# Patient Record
Sex: Male | Born: 1955 | Race: White | Hispanic: No | Smoking: Former smoker
Health system: Southern US, Community
[De-identification: ages and names within clinical notes are randomized; demographics above are authoritative.]

## PROBLEM LIST (undated history)

## (undated) DIAGNOSIS — I5189 Other ill-defined heart diseases: Secondary | ICD-10-CM

## (undated) DIAGNOSIS — Z8249 Family history of ischemic heart disease and other diseases of the circulatory system: Secondary | ICD-10-CM

## (undated) DIAGNOSIS — K5790 Diverticulosis of intestine, part unspecified, without perforation or abscess without bleeding: Secondary | ICD-10-CM

## (undated) DIAGNOSIS — I10 Essential (primary) hypertension: Secondary | ICD-10-CM

## (undated) DIAGNOSIS — R0602 Shortness of breath: Secondary | ICD-10-CM

## (undated) DIAGNOSIS — I499 Cardiac arrhythmia, unspecified: Secondary | ICD-10-CM

## (undated) DIAGNOSIS — D490 Neoplasm of unspecified behavior of digestive system: Secondary | ICD-10-CM

## (undated) HISTORY — DX: Other ill-defined heart diseases: I51.89

## (undated) HISTORY — DX: Family history of ischemic heart disease and other diseases of the circulatory system: Z82.49

---

## 1999-07-27 HISTORY — PX: BACK SURGERY: SHX140

## 1999-09-20 ENCOUNTER — Ambulatory Visit (HOSPITAL_COMMUNITY): Admission: RE | Admit: 1999-09-20 | Discharge: 1999-09-20 | Payer: Self-pay | Admitting: Family Medicine

## 1999-09-20 ENCOUNTER — Encounter: Payer: Self-pay | Admitting: Family Medicine

## 1999-10-01 ENCOUNTER — Encounter: Payer: Self-pay | Admitting: Neurosurgery

## 1999-10-02 ENCOUNTER — Inpatient Hospital Stay (HOSPITAL_COMMUNITY): Admission: RE | Admit: 1999-10-02 | Discharge: 1999-10-03 | Payer: Self-pay | Admitting: Neurosurgery

## 1999-10-02 ENCOUNTER — Encounter: Payer: Self-pay | Admitting: Neurosurgery

## 2002-07-26 HISTORY — PX: HERNIA REPAIR: SHX51

## 2006-04-27 ENCOUNTER — Encounter: Admission: RE | Admit: 2006-04-27 | Discharge: 2006-04-27 | Payer: Self-pay | Admitting: *Deleted

## 2011-07-09 ENCOUNTER — Encounter (HOSPITAL_COMMUNITY)
Admission: RE | Disposition: A | Discharge status: Home / Self Care | Payer: Self-pay | Source: Ambulatory Visit | Attending: Gastroenterology

## 2011-07-09 ENCOUNTER — Encounter (HOSPITAL_COMMUNITY): Payer: Self-pay | Admitting: *Deleted

## 2011-07-09 ENCOUNTER — Other Ambulatory Visit: Payer: Self-pay | Admitting: Gastroenterology

## 2011-07-09 ENCOUNTER — Ambulatory Visit (HOSPITAL_COMMUNITY)
Admission: RE | Admit: 2011-07-09 | Discharge: 2011-07-09 | Disposition: A | Discharge status: Home / Self Care | Payer: BC Managed Care – PPO | Source: Ambulatory Visit | Attending: Gastroenterology | Admitting: Gastroenterology

## 2011-07-09 DIAGNOSIS — K319 Disease of stomach and duodenum, unspecified: Secondary | ICD-10-CM | POA: Insufficient documentation

## 2011-07-09 DIAGNOSIS — Z8043 Family history of malignant neoplasm of testis: Secondary | ICD-10-CM | POA: Insufficient documentation

## 2011-07-09 DIAGNOSIS — Z7982 Long term (current) use of aspirin: Secondary | ICD-10-CM | POA: Insufficient documentation

## 2011-07-09 HISTORY — DX: Diverticulosis of intestine, part unspecified, without perforation or abscess without bleeding: K57.90

## 2011-07-09 HISTORY — PX: EUS: SHX5427

## 2011-07-09 SURGERY — UPPER ENDOSCOPIC ULTRASOUND (EUS) LINEAR
Anesthesia: Moderate Sedation

## 2011-07-09 MED ORDER — SODIUM CHLORIDE 0.9 % IV SOLN
Freq: Once | INTRAVENOUS | Status: AC
  Administered 2011-07-09: 500 mL via INTRAVENOUS

## 2011-07-09 MED ORDER — LIDOCAINE VISCOUS 2 % MT SOLN
OROMUCOSAL | Status: DC | PRN
  Administered 2011-07-09: 5 mL via OROMUCOSAL

## 2011-07-09 MED ORDER — FENTANYL CITRATE 0.05 MG/ML IJ SOLN
INTRAMUSCULAR | Status: AC
  Filled 2011-07-09: qty 4

## 2011-07-09 MED ORDER — FENTANYL CITRATE 0.05 MG/ML IJ SOLN
INTRAMUSCULAR | Status: DC | PRN
  Administered 2011-07-09 (×4): 25 ug via INTRAVENOUS

## 2011-07-09 MED ORDER — DIPHENHYDRAMINE HCL 50 MG/ML IJ SOLN
INTRAMUSCULAR | Status: AC
  Filled 2011-07-09: qty 1

## 2011-07-09 MED ORDER — MIDAZOLAM HCL 10 MG/2ML IJ SOLN
INTRAMUSCULAR | Status: AC
  Filled 2011-07-09: qty 4

## 2011-07-09 MED ORDER — LIDOCAINE VISCOUS 2 % MT SOLN
OROMUCOSAL | Status: AC
  Filled 2011-07-09: qty 15

## 2011-07-09 MED ORDER — MIDAZOLAM HCL 10 MG/2ML IJ SOLN
INTRAMUSCULAR | Status: DC | PRN
  Administered 2011-07-09 (×5): 2 mg via INTRAVENOUS

## 2011-07-09 MED ORDER — DIPHENHYDRAMINE HCL 50 MG/ML IJ SOLN
INTRAMUSCULAR | Status: DC | PRN
  Administered 2011-07-09: 12.5 mg via INTRAVENOUS

## 2011-07-09 NOTE — H&P (Signed)
  Jerry Grant is an 55 y.o. male.   Chief Complaint: ? Gastric GIST HPI: The patient had 72 hours of melena last week.  His EGD/Colonoscopy as an outpatient revealed a 1.5-2 cm submucosal lesion in the proximal body of the stomach.  There was an ulceration on this lesion and it was friable.  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   No past medical history on file.  No past surgical history on file.  No family history on file. Social History:  does not have a smoking history on file. He does not have any smokeless tobacco history on file. His alcohol and drug histories not on file.  Allergies: Not on File  No current facility-administered medications on file as of 07/09/2011.   Medications Prior to Admission  Medication Sig Dispense Refill  . aspirin 81 MG tablet Take 81 mg by mouth daily.          No results found for this or any previous visit (from the past 48 hour(s)). No results found.  ROS:  As stated above in the HPI, otherwise negative.  General appearance: alert and no distress Resp: clear to auscultation bilaterally Cardio: regular rate and rhythm, S1, S2 normal, no murmur, click, rub or gallop GI: soft, non-tender; bowel sounds normal; no masses,  no organomegaly Extremities: extremities normal, atraumatic, no cyanosis or edema There were no vitals taken for this visit.   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  Plan: 1) EUS with FNA.  Markham Dumlao D 07/09/2011, 1:45 PM

## 2011-07-09 NOTE — Interval H&P Note (Signed)
History and Physical Interval Note:  07/09/2011 1:48 PM  Jerry Grant  has presented today for surgery, with the diagnosis of submucosal lesion with FNA  The various methods of treatment have been discussed with the patient and family. After consideration of risks, benefits and other options for treatment, the patient has consented to  Procedure(s): UPPER ENDOSCOPIC ULTRASOUND (EUS) LINEAR as a surgical intervention .  [name] history has been reviewed, patient examined, no change in status, stable for surgery.  I have reviewed the patients' chart and labs.  Questions were answered to the patient's satisfaction.     Jaxtyn Linville D

## 2011-07-09 NOTE — Op Note (Signed)
[facility] [address] [place], [place]  [address]  ENDOSCOPIC ULTRASOUND PROCEDURE REPORT  PATIENT:  Jerry Grant, Jerry Grant  MR#:  [id number] BIRTHDATE:  04/20/1956  GENDER:  male ENDOSCOPIST:  Jeani Hawking, MD PROCEDURE DATE:  07/09/2011 PROCEDURE:  Upper EUS w/FNA ASA CLASS:  Class I INDICATIONS:  Submucosal gastric lesion MEDICATIONS:   Fentanyl 100 mcg IV, Versed 10 mg IV, Benadryl 12.5 mg IV  DESCRIPTION OF PROCEDURE:   After the risks benefits and alternatives of the procedure were  explained, informed consent was obtained. The patient was then placed in the left, lateral, decubitus postion and IV sedation was administered. Throughout the procedure, the patient's blood pressure, pulse and oxygen saturations were monitored continuously.  Under direct visualization, the 110536(edit list) endoscope was introduced through the mouth and advanced to the stomach body.  Water was used as necessary to provide an acoustic interface.  Upon completion of the imaging, water was removed and the patient was sent to the recovery room in satisfactory condition. <<PROCEDUREIMAGES>>  FINDINGS:   The submucosal proximal gastric lesion was again identified. It measured 2.5 x 1.8 cm. The lesion was hypechoic with well-defined borders on ultrasound. The initial pass with the 19 guage Procore needle did not adequately penetrate the lesion, but some samples were obtained. The next four passes were with the 25 guage FNA needle. After the biopsies the plan was to tattoo the site, but the patient started to wretch.   No significant bleeding was identified after the FNA. At that point, the procedure was terminated.    The scope was then withdrawn from the patient and the procedure completed.  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 2) Surgical evaluation as the  pateint was symptomatic with bleeding from the site as the initial presentation.  ______________________________ Jeani Hawking, MD  n. Rosalie DoctorJeani Hawking at 07/09/2011 03:33 PM  Bobbie Stack, [phone]

## 2011-07-12 ENCOUNTER — Encounter (HOSPITAL_COMMUNITY): Payer: Self-pay | Admitting: Gastroenterology

## 2011-07-12 ENCOUNTER — Encounter (HOSPITAL_COMMUNITY): Payer: Self-pay

## 2011-07-13 ENCOUNTER — Telehealth (INDEPENDENT_AMBULATORY_CARE_PROVIDER_SITE_OTHER): Payer: Self-pay | Admitting: General Surgery

## 2011-07-13 NOTE — Telephone Encounter (Signed)
LMOM for pt and his wife.  We are waiting to schedule his appt most likely with Dr. Johna Sheriff who will be able to schedule him for surgery sooner.

## 2011-07-13 NOTE — Telephone Encounter (Signed)
Dr. Elnoria Howard is referring this patient over to see Dr. Armanda Heritage, he is only here 3 days in January, and the February schedule is not out yet, please call.

## 2011-07-14 ENCOUNTER — Encounter (HOSPITAL_COMMUNITY): Payer: Self-pay | Admitting: Pharmacy Technician

## 2011-07-16 ENCOUNTER — Encounter (HOSPITAL_COMMUNITY): Payer: Self-pay

## 2011-07-16 ENCOUNTER — Encounter (HOSPITAL_COMMUNITY)
Admission: RE | Admit: 2011-07-16 | Discharge: 2011-07-16 | Disposition: A | Discharge status: Home / Self Care | Payer: BC Managed Care – PPO | Source: Ambulatory Visit | Attending: General Surgery | Admitting: General Surgery

## 2011-07-16 DIAGNOSIS — R0602 Shortness of breath: Secondary | ICD-10-CM

## 2011-07-16 DIAGNOSIS — I499 Cardiac arrhythmia, unspecified: Secondary | ICD-10-CM

## 2011-07-16 DIAGNOSIS — D490 Neoplasm of unspecified behavior of digestive system: Secondary | ICD-10-CM

## 2011-07-16 HISTORY — DX: Neoplasm of unspecified behavior of digestive system: D49.0

## 2011-07-16 HISTORY — DX: Shortness of breath: R06.02

## 2011-07-16 HISTORY — PX: LAMINOTOMY: SHX998

## 2011-07-16 HISTORY — PX: VASECTOMY: SHX75

## 2011-07-16 HISTORY — DX: Cardiac arrhythmia, unspecified: I49.9

## 2011-07-16 LAB — SURGICAL PCR SCREEN: MRSA, PCR: NEGATIVE

## 2011-07-16 LAB — CBC
MCH: 32.9 pg (ref 26.0–34.0)
MCHC: 34.9 g/dL (ref 30.0–36.0)
MCV: 94.2 fL (ref 78.0–100.0)
Platelets: 202 10*3/uL (ref 150–400)
RDW: 13.9 % (ref 11.5–15.5)

## 2011-07-16 NOTE — Patient Instructions (Signed)
[address] QU[address] FERNS  07/16/2011   Your procedure is scheduled on: 07-28-11  Report to [facility] at  0800 AM.  Call this number if you have problems the morning of surgery: [phone]   Remember:   Do not eat food:After Midnight.  May have clear liquids:until Midnight .  Clear liquids include soda, tea, black coffee, apple or grape juice, broth.  Take these medicines the morning of surgery with A SIP OF WATER:none   Do not wear jewelry, make-up or nail polish.  Do not wear lotions, powders, or perfumes. You may wear deodorant.  Do not shave 48 hours prior to surgery.  Do not bring valuables to the hospital.  Contacts, dentures or bridgework may not be worn into surgery.  Leave suitcase in the car. After surgery it may be brought to your room.  For patients admitted to the hospital, checkout time is 11:00 AM the day of discharge.   Patients discharged the day of surgery will not be allowed to drive home.  Name and phone number of your driver:spouse  Special Instructions: CHG Shower Use Special Wash: 1/2 bottle night before surgery and 1/2 bottle morning of surgery.   Please read over the following fact sheets that you were given: Blood Transfusion Information and MRSA Information

## 2011-07-16 NOTE — Pre-Procedure Instructions (Signed)
07-16-11 1630 Pt. Notified of positive PCR screen for staph aureus-will get RX filled for Mupirocin Oint.

## 2011-07-23 ENCOUNTER — Encounter (INDEPENDENT_AMBULATORY_CARE_PROVIDER_SITE_OTHER): Payer: Self-pay | Admitting: General Surgery

## 2011-07-23 ENCOUNTER — Other Ambulatory Visit (INDEPENDENT_AMBULATORY_CARE_PROVIDER_SITE_OTHER): Payer: Self-pay | Admitting: General Surgery

## 2011-07-23 ENCOUNTER — Ambulatory Visit (INDEPENDENT_AMBULATORY_CARE_PROVIDER_SITE_OTHER): Payer: BC Managed Care – PPO | Admitting: General Surgery

## 2011-07-23 DIAGNOSIS — D214 Benign neoplasm of connective and other soft tissue of abdomen: Secondary | ICD-10-CM | POA: Insufficient documentation

## 2011-07-23 NOTE — Progress Notes (Signed)
Subjective:   Episode of intestinal bleeding and GI ST tumor  Patient ID: Jerry Grant, male   DOB: 12/23/1955, 55 y.o.   MRN: [id number]  HPI Patient is a 55 year old male who just over 2 weeks ago had an episode of melanotic stool. He was therefore referred for endoscopy which was performed by Dr. Elnoria Howard. Patient was not hospitalized. He did not require transfusion. I've reviewed the endoscopy which showed an approximately 2 cm submucosal tumor in the proximal body of the stomach with overlying umbilication consistent with bleeding source, probable GIS tumor. The patient subsequently has had an ultrasound confirming an approximately 2.5 cm submucosal mass. Fine needle aspiration was performed which returned with a very broad differential diagnosis including just as well as other tumors. The patient has been asymptomatic since that episode. Dr. Elnoria Howard has been out of town and unable to discuss the exact location of the tumor, i.e. lesser or greater curve but I left a message will plan to talk to him preoperatively. Patient is now here for surgical evaluation.  Past Medical History  Diagnosis Date  . Diverticulosis   . Dysrhythmia 07-16-11    '05/'06 issues with rapid heart beat, stress negative.  . Shortness of breath 07-16-11    2 weeks ago-testing found related to gastric tumor with bleeding  . Gastric tumor 07-16-11    dx. 07-08-11, now surgery planned   Past Surgical History  Procedure Date  . Laminotomy 07-16-11    lumbar 4-5 disc surgery  . Eus 07/09/2011    Procedure: UPPER ENDOSCOPIC ULTRASOUND (EUS) LINEAR;  Surgeon: Theda Belfast;  Location: [facility];  Service: Endoscopy;  Laterality: N/A;  . Vasectomy 07-16-11    '89   . Hernia repair 2004    Rt. inguinal hernia repair   Current Outpatient Prescriptions  Medication Sig Dispense Refill  . aspirin EC 81 MG tablet Take 81 mg by mouth daily after breakfast.         No Known Allergies History  Substance Use Topics  .  Smoking status: Former Smoker    Quit date: 07/15/1984  . Smokeless tobacco: Not on file  . Alcohol Use: No    Review of Systems  HENT: Negative.   Respiratory: Negative.   Cardiovascular: Negative.   Gastrointestinal: Negative.   Genitourinary: Negative.   Musculoskeletal: Negative.   Hematological: Negative.        Objective:   Physical Exam General: Alert, well-developed Caucasian male, in no distress Skin: Warm and dry without rash or infection. HEENT: No palpable masses or thyromegaly. Sclera nonicteric. Pupils equal round and reactive. Oropharynx clear. Lymph nodes: No cervical, supraclavicular, or inguinal nodes palpable. Lungs: Breath sounds clear and equal without increased work of breathing Cardiovascular: Regular rate and rhythm without murmur. No JVD or edema. Peripheral pulses intact. Abdomen: Nondistended. Soft and nontender. No masses palpable. No organomegaly. No palpable hernias. Extremities: No edema or joint swelling or deformity. No chronic venous stasis changes. Neurologic: Alert and fully oriented. Gait normal.     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 This clearly will need to be resected. With this position in the midbody the stomach I think a laparoscopic resection would be quite feasible. I discussed the nature of the illness the surgery and its indications as well as risks of bleeding, infection, staple line leak, anesthetic complications, and possible leak for surgery with the patient and his wife. All her questions were answered. Surgery  is scheduled for next week.    Plan:     Laparoscopic and possible open resection of probable GI ST tumor of the stomach.

## 2011-07-27 MED ORDER — CEFAZOLIN SODIUM 1-5 GM-% IV SOLN: 1.0000 g | Freq: Three times a day (TID) | INTRAVENOUS | Status: DC

## 2011-07-28 ENCOUNTER — Inpatient Hospital Stay (HOSPITAL_COMMUNITY): Payer: BC Managed Care – PPO | Admitting: Anesthesiology

## 2011-07-28 ENCOUNTER — Encounter (HOSPITAL_COMMUNITY)
Admission: RE | Disposition: A | Discharge status: Home / Self Care | Payer: Self-pay | Source: Ambulatory Visit | Attending: General Surgery

## 2011-07-28 ENCOUNTER — Encounter (HOSPITAL_COMMUNITY): Payer: Self-pay | Admitting: Anesthesiology

## 2011-07-28 ENCOUNTER — Inpatient Hospital Stay (HOSPITAL_COMMUNITY)
Admission: RE | Admit: 2011-07-28 | Discharge: 2011-07-30 | DRG: 155 | Disposition: A | Discharge status: Home / Self Care | Payer: BC Managed Care – PPO | Source: Ambulatory Visit | Attending: General Surgery | Admitting: General Surgery

## 2011-07-28 ENCOUNTER — Other Ambulatory Visit (INDEPENDENT_AMBULATORY_CARE_PROVIDER_SITE_OTHER): Payer: Self-pay | Admitting: General Surgery

## 2011-07-28 ENCOUNTER — Encounter (HOSPITAL_COMMUNITY): Payer: Self-pay | Admitting: *Deleted

## 2011-07-28 DIAGNOSIS — D214 Benign neoplasm of connective and other soft tissue of abdomen: Secondary | ICD-10-CM

## 2011-07-28 DIAGNOSIS — D131 Benign neoplasm of stomach: Principal | ICD-10-CM | POA: Diagnosis present

## 2011-07-28 HISTORY — PX: GASTRECTOMY: SHX58

## 2011-07-28 LAB — CBC
MCV: 95.8 fL (ref 78.0–100.0)
Platelets: 148 10*3/uL — ABNORMAL LOW (ref 150–400)
RBC: 4.09 MIL/uL — ABNORMAL LOW (ref 4.22–5.81)
WBC: 10.6 10*3/uL — ABNORMAL HIGH (ref 4.0–10.5)

## 2011-07-28 LAB — CREATININE, SERUM
Creatinine, Ser: 1.02 mg/dL (ref 0.50–1.35)
GFR calc Af Amer: >90 mL/min (ref >90)
GFR calc non Af Amer: 80 mL/min — ABNORMAL LOW (ref >90)

## 2011-07-28 SURGERY — LAPAROSCOPIC PARTIAL GASTRECTOMY
Anesthesia: General | Site: Abdomen | Wound class: Clean Contaminated

## 2011-07-28 MED ORDER — GLYCOPYRROLATE 0.2 MG/ML IJ SOLN
INTRAMUSCULAR | Status: DC | PRN
  Administered 2011-07-28: .7 mg via INTRAVENOUS

## 2011-07-28 MED ORDER — ACETAMINOPHEN 10 MG/ML IV SOLN
1000.0000 mg | Freq: Four times a day (QID) | INTRAVENOUS | Status: AC
  Administered 2011-07-28 – 2011-07-29 (×4): 1000 mg via INTRAVENOUS
  Filled 2011-07-28 (×4): qty 100

## 2011-07-28 MED ORDER — LIDOCAINE HCL (CARDIAC) 20 MG/ML IV SOLN
INTRAVENOUS | Status: DC | PRN
  Administered 2011-07-28: 50 mg via INTRAVENOUS

## 2011-07-28 MED ORDER — BUPIVACAINE LIPOSOME 1.3 % IJ SUSP
20.0000 mL | Freq: Once | INTRAMUSCULAR | Status: DC
  Filled 2011-07-28: qty 20

## 2011-07-28 MED ORDER — CEFAZOLIN SODIUM 1-5 GM-% IV SOLN
INTRAVENOUS | Status: DC | PRN
  Administered 2011-07-28: 2 g via INTRAVENOUS

## 2011-07-28 MED ORDER — BUPIVACAINE LIPOSOME 1.3 % IJ SUSP
INTRAMUSCULAR | Status: DC | PRN
  Administered 2011-07-28: 20 mL

## 2011-07-28 MED ORDER — FENTANYL CITRATE 0.05 MG/ML IJ SOLN
INTRAMUSCULAR | Status: DC | PRN
  Administered 2011-07-28: 100 ug via INTRAVENOUS
  Administered 2011-07-28: 50 ug via INTRAVENOUS
  Administered 2011-07-28: 100 ug via INTRAVENOUS

## 2011-07-28 MED ORDER — PROPOFOL 10 MG/ML IV BOLUS
INTRAVENOUS | Status: DC | PRN
  Administered 2011-07-28: 180 mg via INTRAVENOUS

## 2011-07-28 MED ORDER — ROCURONIUM BROMIDE 100 MG/10ML IV SOLN
INTRAVENOUS | Status: DC | PRN
  Administered 2011-07-28: 10 mg via INTRAVENOUS
  Administered 2011-07-28: 50 mg via INTRAVENOUS
  Administered 2011-07-28: 10 mg via INTRAVENOUS

## 2011-07-28 MED ORDER — ONDANSETRON HCL 4 MG/2ML IJ SOLN
INTRAMUSCULAR | Status: DC | PRN
  Administered 2011-07-28: 4 mg via INTRAVENOUS

## 2011-07-28 MED ORDER — HYDROMORPHONE HCL PF 1 MG/ML IJ SOLN
INTRAMUSCULAR | Status: DC | PRN
  Administered 2011-07-28: 1 mg via INTRAVENOUS
  Administered 2011-07-28: 0.5 mg via INTRAVENOUS
  Administered 2011-07-28: .5 mg via INTRAVENOUS

## 2011-07-28 MED ORDER — PROMETHAZINE HCL 25 MG/ML IJ SOLN: 6.2500 mg | INTRAMUSCULAR | Status: DC | PRN

## 2011-07-28 MED ORDER — ONDANSETRON HCL 4 MG PO TABS: 4.0000 mg | ORAL_TABLET | Freq: Four times a day (QID) | ORAL | Status: DC | PRN

## 2011-07-28 MED ORDER — LACTATED RINGERS IV SOLN
INTRAVENOUS | Status: DC | PRN
  Administered 2011-07-28 (×3): via INTRAVENOUS

## 2011-07-28 MED ORDER — NEOSTIGMINE METHYLSULFATE 1 MG/ML IJ SOLN
INTRAMUSCULAR | Status: DC | PRN
  Administered 2011-07-28: 4 mg via INTRAVENOUS

## 2011-07-28 MED ORDER — LACTATED RINGERS IV SOLN: INTRAVENOUS | Status: DC

## 2011-07-28 MED ORDER — CHLORHEXIDINE GLUCONATE 4 % EX LIQD: 1.0000 "application " | Freq: Once | CUTANEOUS | Status: DC

## 2011-07-28 MED ORDER — SUCCINYLCHOLINE CHLORIDE 20 MG/ML IJ SOLN
INTRAMUSCULAR | Status: DC | PRN
  Administered 2011-07-28: 100 mg via INTRAVENOUS

## 2011-07-28 MED ORDER — HYDROMORPHONE HCL PF 1 MG/ML IJ SOLN: 0.2500 mg | INTRAMUSCULAR | Status: DC | PRN

## 2011-07-28 MED ORDER — KCL IN DEXTROSE-NACL 20-5-0.9 MEQ/L-%-% IV SOLN
INTRAVENOUS | Status: DC
  Administered 2011-07-28 – 2011-07-29 (×2): via INTRAVENOUS
  Administered 2011-07-30: 50 mL via INTRAVENOUS
  Filled 2011-07-28 (×5): qty 1000

## 2011-07-28 MED ORDER — HEPARIN SODIUM (PORCINE) 5000 UNIT/ML IJ SOLN
5000.0000 [IU] | Freq: Three times a day (TID) | INTRAMUSCULAR | Status: DC
  Administered 2011-07-29 – 2011-07-30 (×4): 5000 [IU] via SUBCUTANEOUS
  Filled 2011-07-28 (×7): qty 1

## 2011-07-28 MED ORDER — ONDANSETRON HCL 4 MG/2ML IJ SOLN: 4.0000 mg | Freq: Four times a day (QID) | INTRAMUSCULAR | Status: DC | PRN

## 2011-07-28 MED ORDER — MORPHINE SULFATE 2 MG/ML IJ SOLN: 2.0000 mg | INTRAMUSCULAR | Status: DC | PRN

## 2011-07-28 MED ORDER — CEFAZOLIN SODIUM-DEXTROSE 2-3 GM-% IV SOLR: 2.0000 g | Freq: Once | INTRAVENOUS | Status: DC

## 2011-07-28 MED ORDER — MIDAZOLAM HCL 5 MG/5ML IJ SOLN
INTRAMUSCULAR | Status: DC | PRN
  Administered 2011-07-28: 2 mg via INTRAVENOUS

## 2011-07-28 SURGICAL SUPPLY — 70 items
ADH SKN CLS APL DERMABOND .7 (GAUZE/BANDAGES/DRESSINGS)
APPLIER CLIP ROT 10 11.4 M/L (STAPLE)
APR CLP MED LRG 11.4X10 (STAPLE)
BAG SPEC RTRVL LRG 6X4 10 (ENDOMECHANICALS) ×1
BLADE HEX COATED 2.75 (ELECTRODE) ×2 IMPLANT
CANNULA ENDOPATH XCEL 11M (ENDOMECHANICALS) IMPLANT
CLAMP ENDO BABCK 10MM (STAPLE) IMPLANT
CLIP APPLIE ROT 10 11.4 M/L (STAPLE) IMPLANT
CLIP SUT LAPRA TY ABSORB (SUTURE) ×1 IMPLANT
CLOTH BEACON ORANGE TIMEOUT ST (SAFETY) ×2 IMPLANT
COVER MAYO STAND STRL (DRAPES) ×2 IMPLANT
DERMABOND ADVANCED (GAUZE/BANDAGES/DRESSINGS)
DERMABOND ADVANCED .7 DNX12 (GAUZE/BANDAGES/DRESSINGS) IMPLANT
DEVICE SUTURE ENDOST 10MM (ENDOMECHANICALS) ×1 IMPLANT
DRAPE LAPAROSCOPIC ABDOMINAL (DRAPES) ×2 IMPLANT
DRAPE WARM FLUID 44X44 (DRAPE) ×3 IMPLANT
ELECT REM PT RETURN 9FT ADLT (ELECTROSURGICAL) ×2
ELECTRODE REM PT RTRN 9FT ADLT (ELECTROSURGICAL) ×1 IMPLANT
ENDOLOOP SUT PDS II  0 18 (SUTURE)
ENDOLOOP SUT PDS II 0 18 (SUTURE) IMPLANT
GLOVE BIO SURGEON STRL SZ7 (GLOVE) ×2 IMPLANT
GLOVE BIOGEL PI IND STRL 7.0 (GLOVE) ×1 IMPLANT
GLOVE BIOGEL PI IND STRL 7.5 (GLOVE) ×1 IMPLANT
GLOVE BIOGEL PI INDICATOR 7.0 (GLOVE) ×1
GLOVE BIOGEL PI INDICATOR 7.5 (GLOVE) ×1
GOWN PREVENTION PLUS LG XLONG (DISPOSABLE) ×2 IMPLANT
GOWN PREVENTION PLUS XLARGE (GOWN DISPOSABLE) ×2 IMPLANT
GOWN STRL NON-REIN LRG LVL3 (GOWN DISPOSABLE) ×2 IMPLANT
GOWN STRL REIN XL XLG (GOWN DISPOSABLE) ×2 IMPLANT
KIT BASIN OR (CUSTOM PROCEDURE TRAY) ×2 IMPLANT
LIGASURE IMPACT 36 18CM CVD LR (INSTRUMENTS) IMPLANT
NS IRRIG 1000ML POUR BTL (IV SOLUTION) ×4 IMPLANT
PEN SKIN MARKING BROAD (MISCELLANEOUS) IMPLANT
PENCIL BUTTON HOLSTER BLD 10FT (ELECTRODE) ×2 IMPLANT
POUCH SPECIMEN RETRIEVAL 10MM (ENDOMECHANICALS) ×1 IMPLANT
RELOAD ENDO STITCH 2.0 (ENDOMECHANICALS) ×2
RELOAD GOLD (STAPLE) ×2 IMPLANT
RELOAD SUT SNGL STCH BLK 2-0 (ENDOMECHANICALS) IMPLANT
SCALPEL HARMONIC ACE (MISCELLANEOUS) ×1 IMPLANT
SCISSORS LAP 5X35 DISP (ENDOMECHANICALS) ×2 IMPLANT
SET IRRIG TUBING LAPAROSCOPIC (IRRIGATION / IRRIGATOR) ×2 IMPLANT
SPONGE GAUZE 4X4 12PLY (GAUZE/BANDAGES/DRESSINGS) ×2 IMPLANT
SPONGE LAP 18X18 X RAY DECT (DISPOSABLE) ×2 IMPLANT
STAPLE ECHEON FLEX 60 POW ENDO (STAPLE) ×1 IMPLANT
STAPLER 90 3.5 STAND SLIM (STAPLE)
STAPLER 90 3.5 STD SLIM (STAPLE) IMPLANT
STAPLER VISISTAT 35W (STAPLE) ×2 IMPLANT
SUT MNCRL AB 4-0 PS2 18 (SUTURE) IMPLANT
SUT RELOAD ENDO STITCH 2.0 (ENDOMECHANICALS) ×1
SUT SILK 2 0 (SUTURE) ×2
SUT SILK 2 0 SH (SUTURE) ×2 IMPLANT
SUT SILK 2 0 SH CR/8 (SUTURE) ×2 IMPLANT
SUT SILK 2-0 18XBRD TIE 12 (SUTURE) ×1 IMPLANT
SUT SILK 3 0 (SUTURE)
SUT SILK 3 0 SH CR/8 (SUTURE) IMPLANT
SUT SILK 3-0 18XBRD TIE 12 (SUTURE) IMPLANT
SUTURE RELOAD ENDO STITCH 2.0 (ENDOMECHANICALS) ×1 IMPLANT
TIP INNERVISION DETACH 40FR (MISCELLANEOUS) IMPLANT
TIP INNERVISION DETACH 50FR (MISCELLANEOUS) IMPLANT
TIP INNERVISION DETACH 56FR (MISCELLANEOUS) IMPLANT
TIPS INNERVISION DETACH 40FR (MISCELLANEOUS)
TOWEL OR 17X26 10 PK STRL BLUE (TOWEL DISPOSABLE) ×4 IMPLANT
TRAY FOLEY CATH 14FRSI W/METER (CATHETERS) ×2 IMPLANT
TRAY LAP CHOLE (CUSTOM PROCEDURE TRAY) ×2 IMPLANT
TROCAR BLADELESS OPT 5 75 (ENDOMECHANICALS) ×2 IMPLANT
TROCAR XCEL 12X100 BLDLESS (ENDOMECHANICALS) ×2 IMPLANT
TROCAR XCEL BLUNT TIP 100MML (ENDOMECHANICALS) IMPLANT
TROCAR XCEL NON-BLD 11X100MML (ENDOMECHANICALS) ×1 IMPLANT
TUBING INSUFFLATION 10FT LAP (TUBING) ×2 IMPLANT
YANKAUER SUCT BULB TIP 10FT TU (MISCELLANEOUS) ×2 IMPLANT

## 2011-07-28 NOTE — Transfer of Care (Signed)
Immediate Anesthesia Transfer of Care Note  Patient: Jerry Grant  Procedure(s) Performed:  LAPAROSCOPIC PARTIAL GASTRECTOMY - Laparoscopic Partial Gastrectomy  Patient Location: PACU  Anesthesia Type: General  Level of Consciousness: sedated, patient cooperative and responds to stimulaton  Airway & Oxygen Therapy: Patient Spontanous Breathing and Patient connected to face mask oxgen  Post-op Assessment: Report given to PACU RN and Post -op Vital signs reviewed and stable  Post vital signs: Reviewed and stable  Complications: No apparent anesthesia complications

## 2011-07-28 NOTE — Interval H&P Note (Signed)
History and Physical Interval Note:  07/28/2011 9:21 AM  Jerry Grant  has presented today for surgery, with the diagnosis of gastrointetinal stromal tumor stomach  The various methods of treatment have been discussed with the patient and family. After consideration of risks, benefits and other options for treatment, the patient has consented to  Procedure(s): LAPAROSCOPIC PARTIAL GASTRECTOMY as a surgical intervention .  [name] history has been reviewed, patient examined, no change in status, stable for surgery.  I have reviewed the patients' chart and labs.  Questions were answered to the patient's satisfaction.     Adelei Scobey T

## 2011-07-28 NOTE — Anesthesia Preprocedure Evaluation (Addendum)
Anesthesia Evaluation  Patient identified by MRN, date of birth, ID band Patient awake    Reviewed: Allergy & Precautions, H&P , NPO status , Patient's Chart, lab work & pertinent test results  Airway Mallampati: II TM Distance: >3 FB Neck ROM: full    Dental No notable dental hx. (+) Teeth Intact and Dental Advisory Given   Pulmonary neg pulmonary ROS, shortness of breath,  clear to auscultation  Pulmonary exam normal       Cardiovascular Exercise Tolerance: Good neg cardio ROS + dysrhythmias regular Normal    Neuro/Psych Negative Neurological ROS  Negative Psych ROS   GI/Hepatic negative GI ROS, Neg liver ROS,   Endo/Other  Negative Endocrine ROS  Renal/GU negative Renal ROS  Genitourinary negative   Musculoskeletal   Abdominal   Peds  Hematology negative hematology ROS (+)   Anesthesia Other Findings   Reproductive/Obstetrics negative OB ROS                         Anesthesia Physical Anesthesia Plan  ASA: II  Anesthesia Plan: General   Post-op Pain Management:    Induction: Intravenous  Airway Management Planned: Oral ETT  Additional Equipment:   Intra-op Plan:   Post-operative Plan: Extubation in OR  Informed Consent: I have reviewed the patients History and Physical, chart, labs and discussed the procedure including the risks, benefits and alternatives for the proposed anesthesia with the patient or authorized representative who has indicated his/her understanding and acceptance.   Dental Advisory Given  Plan Discussed with: CRNA and Surgeon  Anesthesia Plan Comments:         Anesthesia Quick Evaluation

## 2011-07-28 NOTE — Anesthesia Postprocedure Evaluation (Signed)
  Anesthesia Post-op Note  Patient: Jerry Grant  Procedure(s) Performed:  LAPAROSCOPIC PARTIAL GASTRECTOMY - Laparoscopic Partial Gastrectomy  Patient Location: PACU  Anesthesia Type: General  Level of Consciousness: awake and alert   Airway and Oxygen Therapy: Patient Spontanous Breathing  Post-op Pain: mild  Post-op Assessment: Post-op Vital signs reviewed, Patient's Cardiovascular Status Stable, Respiratory Function Stable, Patent Airway and No signs of Nausea or vomiting  Post-op Vital Signs: stable  Complications: No apparent anesthesia complications

## 2011-07-28 NOTE — H&P (View-Only) (Signed)
Subjective:   Episode of intestinal bleeding and GI ST tumor  Patient ID: Jerry Grant, male   DOB: 09/12/1955, 55 y.o.   MRN: [id number]  HPI Patient is a 55-year-old male who just over 2 weeks ago had an episode of melanotic stool. He was therefore referred for endoscopy which was performed by Dr. hung. Patient was not hospitalized. He did not require transfusion. I've reviewed the endoscopy which showed an approximately 2 cm submucosal tumor in the proximal body of the stomach with overlying umbilication consistent with bleeding source, probable GIS tumor. The patient subsequently has had an ultrasound confirming an approximately 2.5 cm submucosal mass. Fine needle aspiration was performed which returned with a very broad differential diagnosis including just as well as other tumors. The patient has been asymptomatic since that episode. Dr. Hung has been out of town and unable to discuss the exact location of the tumor, i.e. lesser or greater curve but I left a message will plan to talk to him preoperatively. Patient is now here for surgical evaluation.  Past Medical History  Diagnosis Date  . Diverticulosis   . Dysrhythmia 07-16-11    '05/'06 issues with rapid heart beat, stress negative.  . Shortness of breath 07-16-11    2 weeks ago-testing found related to gastric tumor with bleeding  . Gastric tumor 07-16-11    dx. 07-08-11, now surgery planned   Past Surgical History  Procedure Date  . Laminotomy 07-16-11    lumbar 4-5 disc surgery  . Eus 07/09/2011    Procedure: UPPER ENDOSCOPIC ULTRASOUND (EUS) LINEAR;  Surgeon: Patrick D Hung;  Location: [facility];  Service: Endoscopy;  Laterality: N/A;  . Vasectomy 07-16-11    '89   . Hernia repair 2004    Rt. inguinal hernia repair   Current Outpatient Prescriptions  Medication Sig Dispense Refill  . aspirin EC 81 MG tablet Take 81 mg by mouth daily after breakfast.         No Known Allergies History  Substance Use Topics  .  Smoking status: Former Smoker    Quit date: 07/15/1984  . Smokeless tobacco: Not on file  . Alcohol Use: No    Review of Systems  HENT: Negative.   Respiratory: Negative.   Cardiovascular: Negative.   Gastrointestinal: Negative.   Genitourinary: Negative.   Musculoskeletal: Negative.   Hematological: Negative.        Objective:   Physical Exam General: Alert, well-developed Caucasian male, in no distress Skin: Warm and dry without rash or infection. HEENT: No palpable masses or thyromegaly. Sclera nonicteric. Pupils equal round and reactive. Oropharynx clear. Lymph nodes: No cervical, supraclavicular, or inguinal nodes palpable. Lungs: Breath sounds clear and equal without increased work of breathing Cardiovascular: Regular rate and rhythm without murmur. No JVD or edema. Peripheral pulses intact. Abdomen: Nondistended. Soft and nontender. No masses palpable. No organomegaly. No palpable hernias. Extremities: No edema or joint swelling or deformity. No chronic venous stasis changes. Neurologic: Alert and fully oriented. Gait normal.     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 This clearly will need to be resected. With this position in the midbody the stomach I think a laparoscopic resection would be quite feasible. I discussed the nature of the illness the surgery and its indications as well as risks of bleeding, infection, staple line leak, anesthetic complications, and possible leak for surgery with the patient and his wife. All her questions were answered. Surgery   is scheduled for next week.    Plan:     Laparoscopic and possible open resection of probable GI ST tumor of the stomach.      

## 2011-07-28 NOTE — Op Note (Signed)
Preoperative Diagnosis: GASTROINTESTINAL STROMAL TUMOR STOMACH  Postoprative Diagnosis: GASTROINTESTINAL STROMAL TUMOR STOMACH  Procedure: Procedure(s): LAPAROSCOPIC PARTIAL GASTRECTOMY   Surgeon: Glenna Fellows T   Assistants: Chevis Pretty  Anesthesia:  General endotracheal anesthesiaDiagnos  Indications:  Patient is a 56 year old male who recently presented with an upper GI bleed. Endoscopy and endoscopic ultrasound have revealed a 2-1/2 cm submucosal mass along the greater curve in the midbody of the stomach with overlying umbilication most consistent with a GI ST tumor causing bleeding. We have recommended proceeding with laparoscopic and possible open resection. I discussed the indications for the procedure, its nature and recovery, and risks of bleeding, infection, and staple line leakage, and anesthetic risks.   Procedure Detail: The patient is brought to the operating room, placed in the supine position the operating table, and general anesthesia induced. Orogastric tube and Foley were placed. He received preoperative IV antibiotics. Patient on out was performed and correct procedure verified after widely sterilely prepping and draping the abdomen. Access was obtained with a 12 mm Optiview trocar in the left upper quadrant midclavicular line without difficulty and pneumoperitoneum established. There was no evidence of trocar injury. An 11 mm trocar was placed just above and to the left of the umbilicus, a 12 mm trocar in the right upper quadrant midclavicular line and a 5 mm trocar laterally in the right upper quadrant. Laparoscopy was unremarkable. There was quite good exposure of the stomach without retracting the liver. Carefully examining the anterior surface of the stomach I did not see any evidence of the tumor. The lesser sac was then widely opened dividing the short gastric vessels along the greater curve with the harmonic scalpel. Upon opening the lesser sac the tumor was  immediately apparent just posterior and medial to the greater curve at the mid body of the stomach. It appeared mobile and approximately 2 cm. I placed a 2-0 silk traction suture in the tumor and then was able to lift it anteriorly with very good mobility. A 60 mm echelon reload stapler was applied underneath the tumor with grossly good margins and fired. A small amount of the divided stomach remained superiorly and a second firing of the stapler detached the specimen. There was no bleeding from the staple line. I did oversew it with a running 2-0 silk suture. The specimen was placed in an Endo Catch bag and brought up to the left upper quadrant 12 mm site. The skin and fascial incisions were lengthened slightly and the specimen removed. Gross examination of the specimen on the back table showed a good margin in all directions which appeared to be at least a centimeter. Following this the left upper quadrant incision was closed with 2 figure-of-eight sutures of 0 PDS. The abdomen was inspected for hemostasis and there was no bleeding or other problems seen. All CO2 was then evacuated and trochars removed. The skin incisions were closed with subcuticular Monocryl and Dermabond. Sponge needle and instrument counts were correct. The specimen was sent for permanent pathology.   Estimated Blood Loss:  Minimal         Drains: None  Blood Given: none          Specimens: partial gastrectomy with tumor        Complications:  * No complications entered in OR log *         Disposition: PACU - hemodynamically stable.         Condition: stable  Mariella Saa MD, FACS  07/28/2011, 11:40 AM

## 2011-07-29 LAB — CBC
Platelets: 137 10*3/uL — ABNORMAL LOW (ref 150–400)
RDW: 13.3 % (ref 11.5–15.5)
WBC: 7.1 10*3/uL (ref 4.0–10.5)

## 2011-07-29 MED ORDER — HYDROCODONE-ACETAMINOPHEN 5-325 MG PO TABS
1.0000 | ORAL_TABLET | ORAL | Status: DC | PRN
  Administered 2011-07-29: 1 via ORAL
  Filled 2011-07-29: qty 1

## 2011-07-29 NOTE — Progress Notes (Signed)
Patient ID: Jerry Grant, male   DOB: [birth year], 56 y.o.   MRN: [id number] 1 Day Post-Op  Subjective: No C/O except sore. No nausea  Objective: Vital signs in last 24 hours: Temp:  [97 F (36.1 C)-98.5 F (36.9 C)] 97.6 F (36.4 C) (01/03 0612) Pulse Rate:  [61-90] 63  (01/03 0612) Resp:  [12-18] 18  (01/03 0612) BP: (112-148)/(73-94) 114/75 mmHg (01/03 0612) SpO2:  [97 %-99 %] 98 % (01/03 0612) Weight:  [210 lb (95.255 kg)] 210 lb (95.255 kg) (01/02 1515) Last BM Date: 07/28/11  Intake/Output from previous day: 01/02 0701 - 01/03 0700 In: 3552 [I.V.:3552] Out: 825 [Urine:775; Blood:50] Intake/Output this shift:    General appearance: alert and no distress GI: normal findings: soft, non-tender Incision/Wound:Clean and dry  Lab Results:   Basename 07/29/11 0404 07/28/11 1605  WBC 7.1 10.6*  HGB 13.0 13.4  HCT 38.4* 39.2  PLT 137* 148*   BMET  Basename 07/28/11 1605  NA --  K --  CL --  CO2 --  GLUCOSE --  BUN --  CREATININE 1.02  CALCIUM --   PT/INR No results found for this basename: LABPROT:2,INR:2 in the last 72 hours ABG No results found for this basename: PHART:2,PCO2:2,PO2:2,HCO3:2 in the last 72 hours  Studies/Results: No results found.  Anti-infectives: Anti-infectives     Start     Dose/Rate Route Frequency Ordered Stop   07/28/11 0830   ceFAZolin (ANCEF) IVPB 2 g/50 mL premix  Status:  Discontinued        2 g 100 mL/hr over 30 Minutes Intravenous  Once 07/28/11 0811 07/28/11 1512   07/27/11 1500   ceFAZolin (ANCEF) IVPB 1 g/50 mL premix  Status:  Discontinued        1 g 100 mL/hr over 30 Minutes Intravenous 3 times per day 07/27/11 1457 07/27/11 1501          Assessment/Plan: s/p Procedure(s): LAPAROSCOPIC PARTIAL GASTRECTOMY Doing well. Will start liquid diet   LOS: 1 day    Buddie Marston T 07/29/2011

## 2011-07-30 ENCOUNTER — Telehealth (INDEPENDENT_AMBULATORY_CARE_PROVIDER_SITE_OTHER): Payer: Self-pay | Admitting: General Surgery

## 2011-07-30 MED ORDER — HYDROCODONE-ACETAMINOPHEN 5-325 MG PO TABS: 1.0000 | ORAL_TABLET | ORAL | Status: AC | PRN

## 2011-07-30 NOTE — Progress Notes (Signed)
Patient discharged home with family states understanding of discharge instructions.  Escorted to car by RN Conchita Paris.

## 2011-07-30 NOTE — Discharge Summary (Signed)
   Patient ID: Jerry Grant [id number] 56 y.o. September 07, 1955  07/28/2011  Discharge date and time: 07/30/2011   Admitting Physician: Glenna Fellows T  Discharge Physician: Glenna Fellows T  Admission Diagnoses: GASTROINTESTINAL STROMAL TUMOR STOMACH  Discharge Diagnoses: same  Operations: Procedure(s): LAPAROSCOPIC PARTIAL GASTRECTOMY  Admission Condition: good  Discharged Condition: good  Indication for Admission: the patient is a 56 year old male who recently presented with upper GI bleeding. Endoscopy revealed a 2.5 cm submucosal mass umbilication, likely the source of bleeding, consistent with GI ST tumor. The patient is electively admitted for laparoscopic resection.  Hospital Course: patient underwent uneventful laparoscopic resection on the day the patient with findings of a approximately 2.5 cm tumor posteriorly along the greater curve of the stomach. His postoperative course was unremarkable. He was begun on clear liquid diet on the first postoperative day and tolerated this well. On the second postoperative day he had minimal pain and no nausea. Abdomen was soft and nontender and wounds healing well. Hemoglobin was normal. He was felt ready for discharge.  Consults: none   Disposition: Home  Patient Instructions:   Jerry Grant, Jerry Grant  Home Medication Instructions [id number]   Printed on:07/30/11 0849  Medication Information                    aspirin EC 81 MG tablet Take 81 mg by mouth daily after breakfast.             HYDROcodone-acetaminophen (NORCO) 5-325 MG per tablet Take 1-2 tablets by mouth every 4 (four) hours as needed.             Activity: no heavy lifting for 4 weeks Diet: full liquids for 2 days then soft diet for 5 days Wound Care: none needed  Follow-up:  With Dr. Johna Sheriff in 3 weeks.  Signed: Mariella Saa MD, FACS  07/30/2011, 8:49 AM

## 2011-07-30 NOTE — Progress Notes (Signed)
Patient ID: Jerry Grant, male   DOB: Dec 05, 1955, 56 y.o.   MRN: [id number] 2 Days Post-Op  Subjective: Feels well. Denies nausea or pain. Tolerating clear liquids.  Objective: Vital signs in last 24 hours: Temp:  [97.9 F (36.6 C)-98.5 F (36.9 C)] 97.9 F (36.6 C) (01/04 0615) Pulse Rate:  [66-78] 78  (01/04 0615) Resp:  [18-20] 20  (01/04 0615) BP: (119-136)/(84-90) 119/86 mmHg (01/04 0615) SpO2:  [95 %-98 %] 95 % (01/04 0615) Last BM Date: 07/28/11  Intake/Output from previous day: 01/03 0701 - 01/04 0700 In: 953.8 [P.O.:440; I.V.:513.8] Out: 2230 [Urine:2230] Intake/Output this shift:    General appearance: alert and no distress GI: normal findings: soft, non-tender Incision/Wound:incisions clean and dry without signs of infection  Lab Results:   Basename 07/29/11 0404 07/28/11 1605  WBC 7.1 10.6*  HGB 13.0 13.4  HCT 38.4* 39.2  PLT 137* 148*   BMET  Basename 07/28/11 1605  NA --  K --  CL --  CO2 --  GLUCOSE --  BUN --  CREATININE 1.02  CALCIUM --   PT/INR No results found for this basename: LABPROT:2,INR:2 in the last 72 hours ABG No results found for this basename: PHART:2,PCO2:2,PO2:2,HCO3:2 in the last 72 hours  Studies/Results: No results found.  Anti-infectives: Anti-infectives     Start     Dose/Rate Route Frequency Ordered Stop   07/28/11 0830   ceFAZolin (ANCEF) IVPB 2 g/50 mL premix  Status:  Discontinued        2 g 100 mL/hr over 30 Minutes Intravenous  Once 07/28/11 0811 07/28/11 1512   07/27/11 1500   ceFAZolin (ANCEF) IVPB 1 g/50 mL premix  Status:  Discontinued        1 g 100 mL/hr over 30 Minutes Intravenous 3 times per day 07/27/11 1457 07/27/11 1501          Assessment/Plan: s/p Procedure(s): LAPAROSCOPIC PARTIAL GASTRECTOMY Doing very well following laparoscopic resection of GI [place] tumor. Okay for discharge today.   LOS: 2 days    Lilith Solana T 07/30/2011

## 2011-07-30 NOTE — Telephone Encounter (Signed)
Call the patient and reviewed the path report

## 2011-08-11 ENCOUNTER — Encounter (INDEPENDENT_AMBULATORY_CARE_PROVIDER_SITE_OTHER): Payer: Self-pay | Admitting: Gastroenterology

## 2011-08-18 ENCOUNTER — Encounter (INDEPENDENT_AMBULATORY_CARE_PROVIDER_SITE_OTHER): Payer: Self-pay | Admitting: General Surgery

## 2011-08-18 ENCOUNTER — Ambulatory Visit (INDEPENDENT_AMBULATORY_CARE_PROVIDER_SITE_OTHER): Payer: BC Managed Care – PPO | Admitting: General Surgery

## 2011-08-18 VITALS — BP 132/90 | HR 72 | Temp 97.2°F | Resp 16 | Ht 74.0 in | Wt 202.0 lb

## 2011-08-18 DIAGNOSIS — Z9889 Other specified postprocedural states: Secondary | ICD-10-CM

## 2011-08-18 NOTE — Progress Notes (Signed)
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  He is getting along well, back to full activity and eating normally. He has had no evidence of bleeding.  On exam he appears well. Abdomen is soft and nontender. Laparoscopic incisions are well healed.  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 He is discharged return as needed.

## 2011-10-15 ENCOUNTER — Encounter: Payer: Self-pay | Admitting: *Deleted

## 2011-12-29 ENCOUNTER — Other Ambulatory Visit: Payer: Self-pay | Admitting: Dermatology

## 2012-08-03 ENCOUNTER — Other Ambulatory Visit: Payer: Self-pay | Admitting: Dermatology

## 2012-12-19 ENCOUNTER — Encounter: Payer: Self-pay | Admitting: *Deleted

## 2014-05-20 ENCOUNTER — Encounter (HOSPITAL_COMMUNITY): Payer: Self-pay | Admitting: Emergency Medicine

## 2014-05-20 ENCOUNTER — Emergency Department (HOSPITAL_COMMUNITY): Payer: BC Managed Care – PPO

## 2014-05-20 ENCOUNTER — Emergency Department (HOSPITAL_COMMUNITY)
Admission: EM | Admit: 2014-05-20 | Discharge: 2014-05-20 | Disposition: A | Discharge status: Home / Self Care | Payer: BC Managed Care – PPO | Attending: Emergency Medicine | Admitting: Emergency Medicine

## 2014-05-20 DIAGNOSIS — Z7982 Long term (current) use of aspirin: Secondary | ICD-10-CM | POA: Insufficient documentation

## 2014-05-20 DIAGNOSIS — Z8719 Personal history of other diseases of the digestive system: Secondary | ICD-10-CM | POA: Diagnosis not present

## 2014-05-20 DIAGNOSIS — Z87891 Personal history of nicotine dependence: Secondary | ICD-10-CM | POA: Diagnosis not present

## 2014-05-20 DIAGNOSIS — M79622 Pain in left upper arm: Secondary | ICD-10-CM | POA: Insufficient documentation

## 2014-05-20 DIAGNOSIS — Z8679 Personal history of other diseases of the circulatory system: Secondary | ICD-10-CM | POA: Diagnosis not present

## 2014-05-20 DIAGNOSIS — R200 Anesthesia of skin: Secondary | ICD-10-CM | POA: Insufficient documentation

## 2014-05-20 DIAGNOSIS — R079 Chest pain, unspecified: Secondary | ICD-10-CM | POA: Diagnosis present

## 2014-05-20 DIAGNOSIS — M79602 Pain in left arm: Secondary | ICD-10-CM

## 2014-05-20 LAB — COMPREHENSIVE METABOLIC PANEL
ALT: 42 U/L (ref 0–53)
ANION GAP: 13 (ref 5–15)
AST: 34 U/L (ref 0–37)
Albumin: 4.6 g/dL (ref 3.5–5.2)
Alkaline Phosphatase: 106 U/L (ref 39–117)
BUN: 14 mg/dL (ref 6–23)
CALCIUM: 9.3 mg/dL (ref 8.4–10.5)
CO2: 27 meq/L (ref 19–32)
Chloride: 102 mEq/L (ref 96–112)
Creatinine, Ser: 1 mg/dL (ref 0.50–1.35)
GFR, EST NON AFRICAN AMERICAN: 80 mL/min — AB (ref >90)
GLUCOSE: 99 mg/dL (ref 70–99)
Potassium: 4.3 mEq/L (ref 3.7–5.3)
SODIUM: 142 meq/L (ref 137–147)
Total Bilirubin: 0.3 mg/dL (ref 0.3–1.2)
Total Protein: 7.7 g/dL (ref 6.0–8.3)

## 2014-05-20 LAB — CBC WITH DIFFERENTIAL/PLATELET
Basophils Absolute: 0 10*3/uL (ref 0.0–0.1)
Basophils Relative: 1 % (ref 0–1)
EOS ABS: 0.2 10*3/uL (ref 0.0–0.7)
EOS PCT: 4 % (ref 0–5)
HEMATOCRIT: 47.7 % (ref 39.0–52.0)
HEMOGLOBIN: 16.9 g/dL (ref 13.0–17.0)
LYMPHS ABS: 2.5 10*3/uL (ref 0.7–4.0)
LYMPHS PCT: 40 % (ref 12–46)
MCH: 33.5 pg (ref 26.0–34.0)
MCHC: 35.4 g/dL (ref 30.0–36.0)
MCV: 94.5 fL (ref 78.0–100.0)
MONO ABS: 0.4 10*3/uL (ref 0.1–1.0)
MONOS PCT: 7 % (ref 3–12)
Neutro Abs: 3.1 10*3/uL (ref 1.7–7.7)
Neutrophils Relative %: 48 % (ref 43–77)
PLATELETS: 158 10*3/uL (ref 150–400)
RBC: 5.05 MIL/uL (ref 4.22–5.81)
RDW: 12.6 % (ref 11.5–15.5)
WBC: 6.3 10*3/uL (ref 4.0–10.5)

## 2014-05-20 LAB — TROPONIN I: Troponin I: <0.3 ng/mL (ref <0.30)

## 2014-05-20 MED ORDER — ASPIRIN 81 MG PO CHEW
162.0000 mg | CHEWABLE_TABLET | Freq: Once | ORAL | Status: AC
  Administered 2014-05-20: 162 mg via ORAL
  Filled 2014-05-20: qty 2

## 2014-05-20 NOTE — ED Notes (Signed)
Phlebotomy at bedside.

## 2014-05-20 NOTE — ED Notes (Signed)
Patient transported to X-ray 

## 2014-05-20 NOTE — ED Notes (Signed)
The pt had a 81 mg aspirin at 0300am

## 2014-05-20 NOTE — Discharge Instructions (Signed)
Cardiology is arranging an outpatient stress test for you. If you were given medicines take as directed.  If you are on coumadin or contraceptives realize their levels and effectiveness is altered by many different medicines.  If you have any reaction (rash, tongues swelling, other) to the medicines stop taking and see a physician.   Please follow up as directed and return to the ER or see a physician for new or worsening symptoms.  Thank you. Filed Vitals:   05/20/14 0446 05/20/14 0623 05/20/14 0700  BP: 154/98 125/89 128/90  Pulse: 72 65 69  Temp: 98 F (36.7 C)    TempSrc: Oral    Resp: 16 14 15   Height: 6\' 2"  (1.88 m)    Weight: 210 lb (95.255 kg)    SpO2: 99% 98% 96%

## 2014-05-20 NOTE — ED Notes (Signed)
The pt is c/o lt upper chest pain and lt arm  Pain since 0200am today no sob no dizziness no nor v

## 2014-05-20 NOTE — ED Provider Notes (Signed)
CSN: [id number]     Arrival date & time 05/20/14  0434 History   First MD Initiated Contact with Patient 05/20/14 [phone]     Chief Complaint  Patient presents with  . Chest Pain     (Consider location/radiation/quality/duration/timing/severity/associated sxs/prior Treatment) HPI Comments: 58 year old male with no significant medical history presents  after 2 hr episode of left arm pain and numbness with diaphoresis. Patient awoke with the symptoms, no history of similar symptoms. No chest pain or shortness of breath. No recent surgeries or blood clot history. No cardiac risk factors, past smoker. No recent exertional symptoms. Patient currently asymptomatic.   Patient is a 58 y.o. male presenting with chest pain. The history is provided by the patient.  Chest Pain Associated symptoms: numbness   Associated symptoms: no abdominal pain, no back pain, no fever, no headache, no shortness of breath and not vomiting     Past Medical History  Diagnosis Date  . Diverticulosis   . Dysrhythmia 07-16-11    '05/'06 issues with rapid heart beat, stress negative.  . Shortness of breath 07-16-11    2 weeks ago-testing found related to gastric tumor with bleeding  . Gastric tumor 07-16-11    dx. 07-08-11, now surgery planned  . Diastolic dysfunction   . Chest pain   . FHx: heart disease     of early heart disease   Past Surgical History  Procedure Laterality Date  . Laminotomy  07-16-11    lumbar 4-5 disc surgery  . Eus  07/09/2011    Procedure: UPPER ENDOSCOPIC ULTRASOUND (EUS) LINEAR;  Surgeon: Beryle Beams;  Location: [facility];  Service: Endoscopy;  Laterality: N/A;  . Vasectomy  07-16-11    '89   . Hernia repair  2004    Rt. inguinal hernia repair  . Gastrectomy  07/28/11  . Back surgery  2001    disck   Family History  Problem Relation Age of Onset  . Anesthesia problems Neg Hx   . Malignant hyperthermia Neg Hx   . Heart disease Father   . Heart disease Maternal Grandmother    . Cancer Maternal Grandfather     testicular  . Heart disease Maternal Grandfather   . Heart disease Paternal Grandmother   . Heart disease Paternal Grandfather   . Hypertension Mother   . Hypertension Father   . Coronary artery disease Father     with CABG  . Stroke Father     TIA's  . Aortic aneurysm Father    History  Substance Use Topics  . Smoking status: Former Smoker -- 1.00 packs/day for 10 years    Types: Cigarettes    Quit date: 07/15/1984  . Smokeless tobacco: Not on file  . Alcohol Use: No    Review of Systems  Constitutional: Negative for fever and chills.  HENT: Negative for congestion.   Eyes: Negative for visual disturbance.  Respiratory: Negative for shortness of breath.   Cardiovascular: Negative for chest pain.  Gastrointestinal: Negative for vomiting and abdominal pain.  Genitourinary: Negative for dysuria and flank pain.  Musculoskeletal: Negative for back pain, neck pain and neck stiffness.  Skin: Negative for rash.  Neurological: Positive for numbness. Negative for light-headedness and headaches.      Allergies  Review of patient's allergies indicates no known allergies.  Home Medications   Prior to Admission medications   Medication Sig Start Date End Date Taking? Authorizing Provider  aspirin EC 81 MG tablet Take 81 mg by mouth daily  after breakfast.     Yes Historical Provider, MD  sildenafil (VIAGRA) 50 MG tablet Take 50 mg by mouth daily as needed.    Historical Provider, MD   BP 128/90  Pulse 69  Temp(Src) 98 F (36.7 C) (Oral)  Resp 15  Ht 6\' 2"  (1.88 m)  Wt 210 lb (95.255 kg)  BMI 26.95 kg/m2  SpO2 96% Physical Exam  Nursing note and vitals reviewed. Constitutional: He is oriented to person, place, and time. He appears well-developed and well-nourished.  HENT:  Head: Normocephalic and atraumatic.  Eyes: Conjunctivae are normal. Right eye exhibits no discharge. Left eye exhibits no discharge.  Neck: Normal range of motion.  Neck supple. No tracheal deviation present.  Cardiovascular: Normal rate, regular rhythm and intact distal pulses.   Pulmonary/Chest: Effort normal and breath sounds normal.  Abdominal: Soft. He exhibits no distension. There is no tenderness. There is no guarding.  Musculoskeletal: He exhibits no edema.  Neurological: He is alert and oriented to person, place, and time.  Skin: Skin is warm. No rash noted.  Psychiatric: He has a normal mood and affect.    ED Course  Procedures (including critical care time) Labs Review Labs Reviewed  COMPREHENSIVE METABOLIC PANEL - Abnormal; Notable for the following:    GFR calc non Af Amer 80 (*)    All other components within normal limits  CBC WITH DIFFERENTIAL  TROPONIN I  TROPONIN I    Imaging Review Dg Chest 2 View  05/20/2014   CLINICAL DATA:  Left arm, shoulder, neck pain.  Heart fluttering.  EXAM: CHEST  2 VIEW  COMPARISON:  02/03/2011  FINDINGS: Mild aortic tortuosity, similar to prior. Cardiomediastinal contours are otherwise within normal range. No pleural effusion or pneumothorax. No confluent airspace opacity. Sequelae of prior posterior left rib fractures. Multilevel degenerative change.  IMPRESSION: No radiographic evidence of active cardiopulmonary disease.   Electronically Signed   By: Carlos Levering M.D.   On: 05/20/2014 06:23     EKG Interpretation   Date/Time:  Monday May 20 2014 04:37:40 EDT Ventricular Rate:  72 PR Interval:  176 QRS Duration: 96 QT Interval:  380 QTC Calculation: 416 R Axis:   55 Text Interpretation:  Normal sinus rhythm Normal ECG Confirmed by Dehaven Sine   MD, Kourtlynn Trevor ([id number]) on 05/20/2014 5:15:50 AM      MDM   Final diagnoses:  Left arm pain   Patient presents after episode of diaphoresis and left arm symptoms. Currently asymptomatic and well appearing. No  blood clot risk factors.  FH cardiac. Plan for delta troponin and if negative patient will need very close follow-up outpatient cardiology  for stress test. Discussed strict reasons to return.  Patient well-appearing, asymptomatic on recheck, delta troponin negative. Discussed with cardiology who agrees with arranging outpatient stress test. Patient comfortable with that plan. Patient takes baby aspirin daily.  Results and differential diagnosis were discussed with the patient/parent/guardian. Close follow up outpatient was discussed, comfortable with the plan.   Medications  aspirin chewable tablet 162 mg (162 mg Oral Given 05/20/14 0621)    Filed Vitals:   05/20/14 0446 05/20/14 0623 05/20/14 0700  BP: 154/98 125/89 128/90  Pulse: 72 65 69  Temp: 98 F (36.7 C)    TempSrc: Oral    Resp: 16 14 15   Height: 6\' 2"  (1.88 m)    Weight: 210 lb (95.255 kg)    SpO2: 99% 98% 96%    Final diagnoses:  Left arm pain  Mariea Clonts, MD 05/20/14 [phone]

## 2014-05-21 ENCOUNTER — Ambulatory Visit (INDEPENDENT_AMBULATORY_CARE_PROVIDER_SITE_OTHER): Payer: BC Managed Care – PPO | Admitting: Cardiovascular Disease

## 2014-05-21 ENCOUNTER — Ambulatory Visit: Payer: BC Managed Care – PPO | Admitting: Nurse Practitioner

## 2014-05-21 ENCOUNTER — Encounter: Payer: Self-pay | Admitting: Cardiovascular Disease

## 2014-05-21 VITALS — BP 158/90 | HR 71 | Ht 74.0 in | Wt 208.1 lb

## 2014-05-21 DIAGNOSIS — R079 Chest pain, unspecified: Secondary | ICD-10-CM | POA: Insufficient documentation

## 2014-05-21 DIAGNOSIS — R0600 Dyspnea, unspecified: Secondary | ICD-10-CM | POA: Insufficient documentation

## 2014-05-21 DIAGNOSIS — R072 Precordial pain: Secondary | ICD-10-CM

## 2014-05-21 NOTE — Patient Instructions (Signed)

## 2014-05-21 NOTE — Assessment & Plan Note (Signed)
Atypical  Strong family history of premature CAD  F/U stress echo

## 2014-05-21 NOTE — Progress Notes (Signed)
Patient ID: Jerry Grant, male   DOB: 03-16-1956, 58 y.o.   MRN: [id number]    58 year old male with no significant medical history presented to ER yesterday 10/26  after 2 hr episode of left arm pain and numbness with diaphoresis. Patient awoke with the symptoms 2:am , no history of similar symptoms. Had left sided chest pain radiating to neck shoulder and arm  Accompanied by dyspnea and diaphoresis  . No recent surgeries or blood clot history. No cardiac risk factors, past smoker. No recent exertional symptoms. Patient currently asymptomatic. R/O no acute ECG changes CXR NAD  Arrangement made for outpatient f/u today   His wife has had URI with cough and dyspnea  He has had some palpitations  Took ASA with little benefit  Resolved on its own after a few hours in Er.  No pleuritic component    ROS: Denies fever, malais, weight loss, blurry vision, decreased visual acuity, cough, sputum, SOB, hemoptysis, pleuritic pain, palpitaitons, heartburn, abdominal pain, melena, lower extremity edema, claudication, or rash.  All other systems reviewed and negative   General: Affect appropriate Healthy:  appears stated age 58: normal Neck supple with no adenopathy JVP normal no bruits no thyromegaly Lungs clear with no wheezing and good diaphragmatic motion Heart:  S1/S2 no murmur,rub, gallop or click PMI normal Abdomen: benighn, BS positve, no tenderness, no AAA no bruit.  No HSM or HJR Distal pulses intact with no bruits No edema Neuro non-focal Skin warm and dry No muscular weakness  Medications Current Outpatient Prescriptions  Medication Sig Dispense Refill  . aspirin EC 81 MG tablet Take 81 mg by mouth daily after breakfast.        . sildenafil (VIAGRA) 50 MG tablet Take 50 mg by mouth daily as needed.       No current facility-administered medications for this visit.    Allergies Review of patient's allergies indicates no known allergies.  Family History: Family History    Problem Relation Age of Onset  . Anesthesia problems Neg Hx   . Malignant hyperthermia Neg Hx   . Heart disease Father   . Heart disease Maternal Grandmother   . Cancer Maternal Grandfather     testicular  . Heart disease Maternal Grandfather   . Heart disease Paternal Grandmother   . Heart disease Paternal Grandfather   . Hypertension Mother   . Hypertension Father   . Coronary artery disease Father     with CABG  . Stroke Father     TIA's  . Aortic aneurysm Father     Social History: History   Social History  . Marital Status: Married    Spouse Name: N/A    Number of Children: N/A  . Years of Education: N/A   Occupational History  . Not on file.   Social History Main Topics  . Smoking status: Former Smoker -- 1.00 packs/day for 10 years    Types: Cigarettes    Quit date: 07/15/1984  . Smokeless tobacco: Not on file  . Alcohol Use: No  . Drug Use: No  . Sexual Activity: Yes   Other Topics Concern  . Not on file   Social History Narrative  . No narrative on file    Past Surgical History  Procedure Laterality Date  . Laminotomy  07-16-11    lumbar 4-5 disc surgery  . Eus  07/09/2011    Procedure: UPPER ENDOSCOPIC ULTRASOUND (EUS) LINEAR;  Surgeon: Beryle Beams;  Location: [facility];  Service:  Endoscopy;  Laterality: N/A;  . Vasectomy  07-16-11    '89   . Hernia repair  2004    Rt. inguinal hernia repair  . Gastrectomy  07/28/11  . Back surgery  2001    disck    Past Medical History  Diagnosis Date  . Diverticulosis   . Dysrhythmia 07-16-11    '05/'06 issues with rapid heart beat, stress negative.  . Shortness of breath 07-16-11    2 weeks ago-testing found related to gastric tumor with bleeding  . Gastric tumor 07-16-11    dx. 07-08-11, now surgery planned  . Diastolic dysfunction   . Chest pain   . FHx: heart disease     of early heart disease    Electrocardiogram:  10/26  SR normal ECG   Assessment and Plan

## 2014-05-21 NOTE — Assessment & Plan Note (Signed)
Normal cardiopulmonary exam  CXR ok  F/u echo as part of ETT

## 2014-05-27 ENCOUNTER — Ambulatory Visit (HOSPITAL_COMMUNITY): Payer: BC Managed Care – PPO | Attending: Cardiovascular Disease

## 2014-05-27 DIAGNOSIS — R079 Chest pain, unspecified: Secondary | ICD-10-CM | POA: Insufficient documentation

## 2014-05-27 DIAGNOSIS — I10 Essential (primary) hypertension: Secondary | ICD-10-CM | POA: Insufficient documentation

## 2014-05-27 DIAGNOSIS — R072 Precordial pain: Secondary | ICD-10-CM

## 2014-05-27 DIAGNOSIS — Z8249 Family history of ischemic heart disease and other diseases of the circulatory system: Secondary | ICD-10-CM | POA: Diagnosis not present

## 2014-05-27 DIAGNOSIS — R0600 Dyspnea, unspecified: Secondary | ICD-10-CM

## 2014-05-27 NOTE — Progress Notes (Signed)
Echocardiogram performed.  

## 2014-05-29 ENCOUNTER — Telehealth: Payer: Self-pay | Admitting: Cardiovascular Disease

## 2014-05-29 NOTE — Telephone Encounter (Signed)
Advised him that if he has any further cardiac symptoms, or sob to call and we can get him on the schedule to see someone. Voices good understanding.

## 2014-05-29 NOTE — Telephone Encounter (Signed)
New message     Returning Christine's call----want stress test results

## 2014-05-29 NOTE — Telephone Encounter (Signed)
Patient informed of stress echo results. He is inquiring about followup of any kind. advised

## 2014-06-06 ENCOUNTER — Telehealth (HOSPITAL_BASED_OUTPATIENT_CLINIC_OR_DEPARTMENT_OTHER): Payer: Self-pay

## 2015-06-28 ENCOUNTER — Ambulatory Visit (INDEPENDENT_AMBULATORY_CARE_PROVIDER_SITE_OTHER): Payer: BLUE CROSS/BLUE SHIELD

## 2015-06-28 ENCOUNTER — Ambulatory Visit (INDEPENDENT_AMBULATORY_CARE_PROVIDER_SITE_OTHER): Payer: BLUE CROSS/BLUE SHIELD | Admitting: Family Medicine

## 2015-06-28 VITALS — BP 140/82 | HR 85 | Temp 98.1°F | Resp 17 | Ht 74.0 in | Wt 214.4 lb

## 2015-06-28 DIAGNOSIS — R079 Chest pain, unspecified: Secondary | ICD-10-CM

## 2015-06-28 DIAGNOSIS — Z23 Encounter for immunization: Secondary | ICD-10-CM

## 2015-06-28 DIAGNOSIS — S29019A Strain of muscle and tendon of unspecified wall of thorax, initial encounter: Secondary | ICD-10-CM

## 2015-06-28 DIAGNOSIS — S29009A Unspecified injury of muscle and tendon of unspecified wall of thorax, initial encounter: Secondary | ICD-10-CM | POA: Diagnosis not present

## 2015-06-28 LAB — CBC
HEMATOCRIT: 35.1 % — AB (ref 39.0–52.0)
HEMOGLOBIN: 12.3 g/dL — AB (ref 13.0–17.0)
MCH: 33.1 pg (ref 26.0–34.0)
MCHC: 35 g/dL (ref 30.0–36.0)
MCV: 94.4 fL (ref 78.0–100.0)
MPV: 11.3 fL (ref 8.6–12.4)
Platelets: 31 10*3/uL — ABNORMAL LOW (ref 150–400)
RBC: 3.72 MIL/uL — AB (ref 4.22–5.81)
RDW: 13.6 % (ref 11.5–15.5)
WBC: 18.6 10*3/uL — ABNORMAL HIGH (ref 4.0–10.5)

## 2015-06-28 LAB — COMPREHENSIVE METABOLIC PANEL
ALBUMIN: 4.8 g/dL (ref 3.6–5.1)
ALT: 55 U/L — ABNORMAL HIGH (ref 9–46)
AST: 25 U/L (ref 10–35)
Alkaline Phosphatase: 76 U/L (ref 40–115)
BILIRUBIN TOTAL: 0.5 mg/dL (ref 0.2–1.2)
BUN: 27 mg/dL — ABNORMAL HIGH (ref 7–25)
CALCIUM: 9.2 mg/dL (ref 8.6–10.3)
CHLORIDE: 104 mmol/L (ref 98–110)
CO2: 25 mmol/L (ref 20–31)
CREATININE: 1.33 mg/dL — AB (ref 0.70–1.25)
Glucose, Bld: 93 mg/dL (ref 65–99)
Potassium: 4.3 mmol/L (ref 3.5–5.3)
SODIUM: 137 mmol/L (ref 135–146)
TOTAL PROTEIN: 6.5 g/dL (ref 6.1–8.1)

## 2015-06-28 LAB — TROPONIN I

## 2015-06-28 MED ORDER — CYCLOBENZAPRINE HCL 10 MG PO TABS: 10.0000 mg | ORAL_TABLET | Freq: Two times a day (BID) | ORAL | Status: DC | PRN

## 2015-06-28 MED ORDER — MELOXICAM 15 MG PO TABS: 15.0000 mg | ORAL_TABLET | Freq: Every day | ORAL | Status: DC

## 2015-06-28 NOTE — Patient Instructions (Signed)
I think that your pain is due to chest wall pain and not anything more serious. However I will check a troponin stat to make sure there is no sign of heart muscle strain.    The radiologist will also read your chest x-ray and I will let you know if they say anything different about your films  Use the mobic once a day as needed for pain, and the flexeril as needed for pain and to help relax your muscles The flexeril can make you sleepy so do not use when you need to drive/

## 2015-06-28 NOTE — Progress Notes (Signed)
[facility] [address] [place] [address] [phone]  Date:  06/28/2015   Name:  Jerry Grant   DOB:  1956/04/10   MRN:  [id number]  PCP:  Jerry Shire, MD    Chief Complaint: Chest Pain and Shortness of Breath   History of Present Illness:  Jerry Grant is a 59 y.o. very pleasant male patient who presents with the following:  Here today as a new patent.  He notes a pain in his left rib cage which has been present for  He is ok at rest, but has pain with certain movements  About a month ago hse seemed to injury his trunk. He was resting on his elbow and slipped, pulled her left chest.  It has waxed and waned, gotten worse more recently No cough  No CAD.   He has been feeling SOB with walking for the last couple of weeks.   No weight loss Former smoker  He had a negative stress one year ago following some episodic chest pain Sneezing is painful- also painful if he changes position He has a lot of pain when he laughs- he has to hold his left side when he laughs or sneezes.    He is geneally in good health  History of benign stomach tumor about 4 years ago, he has ow been in good health   Patient Active Problem List   Diagnosis Date Noted  . Chest pain 05/21/2014  . Dyspnea 05/21/2014  . 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 Diverticulosis   . Dysrhythmia 07-16-11    '05/'06 issues with rapid heart beat, stress negative.  . Shortness of breath 07-16-11    2 weeks ago-testing found related to gastric tumor with bleeding  . Gastric tumor 07-16-11    dx. 07-08-11, now surgery planned  . Diastolic dysfunction   . Chest pain   . FHx: heart disease     of early heart disease    Past Surgical History  Procedure Laterality Date  . Laminotomy  07-16-11    lumbar 4-5 disc surgery  . Eus  07/09/2011    Procedure: UPPER ENDOSCOPIC ULTRASOUND (EUS) LINEAR;   Surgeon: Beryle Beams;  Location: [facility];  Service: Endoscopy;  Laterality: N/A;  . Vasectomy  07-16-11    '89   . Hernia repair  2004    Rt. inguinal hernia repair  . Gastrectomy  07/28/11  . Back surgery  2001    disck    Social History  Substance Use Topics  . Smoking status: Former Smoker -- 1.00 packs/day for 10 years    Types: Cigarettes    Quit date: 07/15/1984  . Smokeless tobacco: None  . Alcohol Use: No    Family History  Problem Relation Age of Onset  . Anesthesia problems Neg Hx   . Malignant hyperthermia Neg Hx   . Heart disease Father   . Heart disease Maternal Grandmother   . Cancer Maternal Grandfather     testicular  . Heart disease Maternal Grandfather   . Heart disease Paternal Grandmother   . Heart disease Paternal Grandfather   . Hypertension Mother   . Hypertension Father   . Coronary artery disease Father     with CABG  . Stroke Father     TIA's  . Aortic aneurysm Father     No Known Allergies  Medication list has been reviewed  and updated.  Current Outpatient Prescriptions on File Prior to Visit  Medication Sig Dispense Refill  . aspirin EC 81 MG tablet Take 81 mg by mouth daily after breakfast.      . sildenafil (VIAGRA) 50 MG tablet Take 50 mg by mouth daily as needed.     No current facility-administered medications on file prior to visit.    Review of Systems:  As per HPI- otherwise negative.   Physical Examination: Filed Vitals:   06/28/15 1122  BP: 140/82  Pulse: 85  Temp: 98.1 F (36.7 C)  Resp: 17   Filed Vitals:   06/28/15 1122  Height: 6\' 2"  (1.88 m)  Weight: 214 lb 6.4 oz (97.251 kg)   Body mass index is 27.52 kg/(m^2). Ideal Body Weight: Weight in (lb) to have BMI = 25: 194.3  GEN: WDWN, NAD, Non-toxic, A & O x 3, well appearing HEENT: Atraumatic, Normocephalic. Neck supple. No masses, No LAD.  Bilateral TM wnl, oropharynx normal.  PEERL,EOMI.   Ears and Nose: No external deformity. CV: RRR, No  M/G/R. No JVD. No thrill. No extra heart sounds. He has easily reproducible tenderness over the left lateral ribs.  He confirms that this is the pain he has noticed.  No rash to suggest shingles.   PULM: CTA B, no wheezes, crackles, rhonchi. No retractions. No resp. distress. No accessory muscle use. ABD: S, NT, ND EXTR: No c/c/e NEURO Normal gait.  PSYCH: Normally interactive. Conversant. Not depressed or anxious appearing.  Calm demeanor.   EKG:SR, no change from prior, no acute findings   [facility] reading (PRIMARY) by  Dr. Lorelei Pont. Left ribs/ chest film: old healed rib fractures in left chest wall   LEFT RIBS - 2 VIEW  COMPARISON: Chest radiograph from earlier today.  FINDINGS: Mild deformities in the lateral left eighth, ninth and tenth ribs appear healed and likely represent remote traumatic injuries. No definite acute left rib fracture or suspicious focal osseous lesion. Mild degenerative changes in the thoracic spine.  IMPRESSION: No appreciable acute left rib fracture. Mild deformities in the lateral left eighth, ninth and tenth ribs appear healed and likely represent remote injuries.  CHEST 2 VIEW  COMPARISON: Chest x-ray dated 05/20/2014.  FINDINGS: Heart size is normal. Overall cardiomediastinal silhouette is stable in size and configuration. Lungs are clear. Lung volumes are normal. No pleural effusion. No pneumothorax.  There are old healed rib fractures on the left. Mild degenerative change noted within the thoracic spine. No acute-appearing osseous abnormality.  IMPRESSION: Stable chest x-ray. Lungs are clear and there is no evidence of acute cardiopulmonary abnormality.   Assessment and Plan: Acute thoracic myofascial strain, initial encounter - Plan: meloxicam (MOBIC) 15 MG tablet, cyclobenzaprine (FLEXERIL) 10 MG tablet  Chest pain, unspecified chest pain type - Plan: EKG 12-Lead, DG Chest 2 View, DG Ribs Unilateral Left, CBC, Comprehensive metabolic  panel, Troponin I  Immunization due - Plan: Flu Vaccine QUAD 36+ mos IM  Here today with what seems to be MSK pain in the left thorax.  Pain is easily reproducible on exam, x-ray and EKG are reassuring.  Will do basic labs and also check a troponin for completeness.  Asked him to follow-up if not better in the next few days mobic daily as needed Flexeril BID as needed, cautioned regarding sedation  Troponin negative- left message on machine for pt  Signed Lamar Blinks, MD  Received a call from lab on 12/4- his low platelet count and leukocytosis are quite unexpected.  Called pt at 8am- please come in to clinic today to recheck this.  Wonder if may be a lab error.  In any case he will come in today for a recheck asap.    Results for orders placed or performed in visit on 06/28/15  CBC  Result Value Ref Range   WBC 18.6 (H) 4.0 - 10.5 K/uL   RBC 3.72 (L) 4.22 - 5.81 MIL/uL   Hemoglobin 12.3 (L) 13.0 - 17.0 g/dL   HCT 35.1 (L) 39.0 - 52.0 %   MCV 94.4 78.0 - 100.0 fL   MCH 33.1 26.0 - 34.0 pg   MCHC 35.0 30.0 - 36.0 g/dL   RDW 13.6 11.5 - 15.5 %   Platelets 31 (L) 150 - 400 K/uL   MPV 11.3 8.6 - 12.4 fL  Comprehensive metabolic panel  Result Value Ref Range   Sodium 137 135 - 146 mmol/L   Potassium 4.3 3.5 - 5.3 mmol/L   Chloride 104 98 - 110 mmol/L   CO2 25 20 - 31 mmol/L   Glucose, Bld 93 65 - 99 mg/dL   BUN 27 (H) 7 - 25 mg/dL   Creat 1.33 (H) 0.70 - 1.25 mg/dL   Total Bilirubin 0.5 0.2 - 1.2 mg/dL   Alkaline Phosphatase 76 40 - 115 U/L   AST 25 10 - 35 U/L   ALT 55 (H) 9 - 46 U/L   Total Protein 6.5 6.1 - 8.1 g/dL   Albumin 4.8 3.6 - 5.1 g/dL   Calcium 9.2 8.6 - 10.3 mg/dL  Troponin I  Result Value Ref Range   Troponin I <0.01 <0.06 ng/mL

## 2015-06-29 ENCOUNTER — Other Ambulatory Visit: Payer: Self-pay | Admitting: Radiology

## 2015-06-29 ENCOUNTER — Other Ambulatory Visit (INDEPENDENT_AMBULATORY_CARE_PROVIDER_SITE_OTHER): Payer: BLUE CROSS/BLUE SHIELD | Admitting: Family Medicine

## 2015-06-29 ENCOUNTER — Other Ambulatory Visit: Payer: Self-pay | Admitting: Family Medicine

## 2015-06-29 ENCOUNTER — Encounter: Payer: Self-pay | Admitting: Family Medicine

## 2015-06-29 DIAGNOSIS — D638 Anemia in other chronic diseases classified elsewhere: Secondary | ICD-10-CM | POA: Diagnosis not present

## 2015-06-29 DIAGNOSIS — D696 Thrombocytopenia, unspecified: Secondary | ICD-10-CM | POA: Diagnosis not present

## 2015-06-29 DIAGNOSIS — R899 Unspecified abnormal finding in specimens from other organs, systems and tissues: Secondary | ICD-10-CM

## 2015-06-29 DIAGNOSIS — D72829 Elevated white blood cell count, unspecified: Secondary | ICD-10-CM

## 2015-06-29 LAB — POCT CBC
Granulocyte percent: 26 %G — AB (ref 37–80)
HCT, POC: 32.8 % — AB (ref 43.5–53.7)
Hemoglobin: 11.6 g/dL — AB (ref 14.1–18.1)
Lymph, poc: 11.2 — AB (ref 0.6–3.4)
MCH, POC: 33.6 pg — AB (ref 27–31.2)
MCHC: 35.4 g/dL (ref 31.8–35.4)
MCV: 94.9 fL (ref 80–97)
MID (cbc): 1.7 — AB (ref 0–0.9)
MPV: 8.2 fL (ref 0–99.8)
POC Granulocyte: 4.5 (ref 2–6.9)
POC LYMPH PERCENT: 64.1 %L — AB (ref 10–50)
POC MID %: 9.9 %M (ref 0–12)
Platelet Count, POC: 26 10*3/uL — AB (ref 142–424)
RBC: 3.45 M/uL — AB (ref 4.69–6.13)
RDW, POC: 14.2 %
WBC: 17.4 10*3/uL — AB (ref 4.6–10.2)

## 2015-06-29 LAB — COMPREHENSIVE METABOLIC PANEL
ALT: 42 U/L (ref 9–46)
AST: 21 U/L (ref 10–35)
Albumin: 4.4 g/dL (ref 3.6–5.1)
Alkaline Phosphatase: 75 U/L (ref 40–115)
BUN: 23 mg/dL (ref 7–25)
CO2: 26 mmol/L (ref 20–31)
Calcium: 8.5 mg/dL — ABNORMAL LOW (ref 8.6–10.3)
Chloride: 107 mmol/L (ref 98–110)
Creat: 1.17 mg/dL (ref 0.70–1.25)
Glucose, Bld: 87 mg/dL (ref 65–99)
Potassium: 4.4 mmol/L (ref 3.5–5.3)
Sodium: 141 mmol/L (ref 135–146)
Total Bilirubin: 0.4 mg/dL (ref 0.2–1.2)
Total Protein: 5.9 g/dL — ABNORMAL LOW (ref 6.1–8.1)

## 2015-06-29 LAB — POCT SEDIMENTATION RATE: POCT SED RATE: 13 mm/hr (ref 0–22)

## 2015-06-29 NOTE — Addendum Note (Signed)
Addended by: Heinkel Kitten on: 06/29/2015 02:09 PM   Modules accepted: Orders

## 2015-06-29 NOTE — Progress Notes (Signed)
  59 year old gentleman seen yesterday and found to have low platelets and elevated white count. He was told to follow-up today.  He fell about a month ago injuring his left ribs and this pain in his left chest is gradually getting better. He does have some dyspnea on exertion however.  He's been feeling tired for the last 2-3 weeks with some increasing bruisability. He attributed this to having a cold when his son came home for Thanksgiving  Patient is self-employed and makes business trips. Has a business trip to [facility] tomorrow but will be back Tuesday night.  Objective:  There were no vitals taken for this visit. Results for orders placed or performed in visit on 06/29/15  POCT CBC  Result Value Ref Range   WBC 17.4 (A) 4.6 - 10.2 K/uL   Lymph, poc 11.2 (A) 0.6 - 3.4   POC LYMPH PERCENT 64.1 (A) 10 - 50 %L   MID (cbc) 1.7 (A) 0 - 0.9   POC MID % 9.9 0 - 12 %M   POC Granulocyte 4.5 2 - 6.9   Granulocyte percent 26.0 (A) 37 - 80 %G   RBC 3.45 (A) 4.69 - 6.13 M/uL   Hemoglobin 11.6 (A) 14.1 - 18.1 g/dL   HCT, POC 32.8 (A) 43.5 - 53.7 %   MCV 94.9 80 - 97 fL   MCH, POC 33.6 (A) 27 - 31.2 pg   MCHC 35.4 31.8 - 35.4 g/dL   RDW, POC 14.2 %   Platelet Count, POC 26 (A) 142 - 424 K/uL   MPV 8.2 0 - 99.8 fL   HEENT: Unremarkable Chest: Clear Heart: Regular with a rhythm of about 70 and no murmur Abdomen: Soft nontender without hepatosplenomegaly Skin: Slightly pale, no ecchymoses, no rash  Assessment: Thrombocytopenia, acute with elevated white count needing hematology evaluation. I think this can wait to Wednesday since the symptoms have been slow onset and minimally progressive over the last 3 weeks. I explained to patient that he has a low platelet count and further tests are necessary.  Plan: I will set up a hematology evaluation for Wednesday. He needs a peripheral smear done in the next 24 hours. I'm also ordering a CMET and sedimentation rate.  Signed, Robyn Haber

## 2015-06-29 NOTE — Addendum Note (Signed)
Addended by: Gustavus Bryant on: 06/29/2015 01:23 PM   Modules accepted: Orders

## 2015-06-29 NOTE — Addendum Note (Signed)
Addended by: Gustavus Bryant on: 06/29/2015 01:42 PM   Modules accepted: Orders

## 2015-07-01 ENCOUNTER — Telehealth: Payer: Self-pay | Admitting: *Deleted

## 2015-07-01 DIAGNOSIS — C9500 Acute leukemia of unspecified cell type not having achieved remission: Secondary | ICD-10-CM

## 2015-07-01 LAB — PATHOLOGIST SMEAR REVIEW

## 2015-07-01 NOTE — Telephone Encounter (Signed)
Spoke to Jerry Grant at [facility], and he said he would print off the referral and present it to the provider, and they would see how quickly they could get him in.  He will return call once this is decided.

## 2015-07-01 NOTE — Telephone Encounter (Signed)
MD called from [facility] in regards to this pt.  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

## 2015-07-01 NOTE — Telephone Encounter (Signed)
Dr. Joseph Art discussed peripheral smear results with Jerry Grant and his wife. Will refer to hematology this week, Wednesday or Thursday. Urgent referral placed.

## 2015-07-02 ENCOUNTER — Other Ambulatory Visit (HOSPITAL_BASED_OUTPATIENT_CLINIC_OR_DEPARTMENT_OTHER): Payer: BLUE CROSS/BLUE SHIELD

## 2015-07-02 ENCOUNTER — Ambulatory Visit (HOSPITAL_BASED_OUTPATIENT_CLINIC_OR_DEPARTMENT_OTHER): Payer: BLUE CROSS/BLUE SHIELD | Admitting: Hematology & Oncology

## 2015-07-02 ENCOUNTER — Ambulatory Visit: Payer: BLUE CROSS/BLUE SHIELD

## 2015-07-02 ENCOUNTER — Encounter: Payer: Self-pay | Admitting: Hematology & Oncology

## 2015-07-02 ENCOUNTER — Other Ambulatory Visit (HOSPITAL_COMMUNITY)
Admission: RE | Admit: 2015-07-02 | Discharge: 2015-07-02 | Disposition: A | Discharge status: Home / Self Care | Payer: BLUE CROSS/BLUE SHIELD | Source: Ambulatory Visit | Attending: Hematology & Oncology | Admitting: Hematology & Oncology

## 2015-07-02 VITALS — BP 139/85 | HR 91 | Temp 97.5°F | Resp 18 | Ht 74.0 in | Wt 216.0 lb

## 2015-07-02 DIAGNOSIS — C9202 Acute myeloblastic leukemia, in relapse: Secondary | ICD-10-CM

## 2015-07-02 DIAGNOSIS — C9200 Acute myeloblastic leukemia, not having achieved remission: Secondary | ICD-10-CM

## 2015-07-02 LAB — CBC WITH DIFFERENTIAL (CANCER CENTER ONLY)
HEMATOCRIT: 34.3 % — AB (ref 38.7–49.9)
HEMOGLOBIN: 11.9 g/dL — AB (ref 13.0–17.1)
MCH: 33.3 pg (ref 28.0–33.4)
MCHC: 34.7 g/dL (ref 32.0–35.9)
MCV: 96 fL (ref 82–98)
Platelets: 16 10*3/uL — ABNORMAL LOW (ref 145–400)
RBC: 3.57 10*6/uL — ABNORMAL LOW (ref 4.20–5.70)
RDW: 13.6 % (ref 11.1–15.7)
WBC: 12.2 10*3/uL — AB (ref 4.0–10.0)

## 2015-07-02 LAB — MANUAL DIFFERENTIAL (CHCC SATELLITE)
ALC: 3.7 10*3/uL — ABNORMAL HIGH (ref 0.9–3.3)
ANC (CHCC MAN DIFF): 4 10*3/uL (ref 1.5–6.5)
Band Neutrophils: 4 % (ref 0–10)
Blasts: 34 % — ABNORMAL HIGH (ref 0–0)
EOS: 1 % (ref 0–7)
LYMPH: 30 % (ref 14–48)
MONO: 1 % (ref 0–13)
NRBC: 1 % — AB (ref 0–0)
PLT EST ~~LOC~~: DECREASED
PROMYELO: 1 % — ABNORMAL HIGH (ref 0–0)
SEG: 29 % — AB (ref 40–75)

## 2015-07-02 LAB — LACTATE DEHYDROGENASE: LDH: 312 U/L — ABNORMAL HIGH (ref 125–245)

## 2015-07-02 LAB — CHCC SATELLITE - SMEAR

## 2015-07-02 NOTE — Progress Notes (Signed)
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 OTHER    New Patient  : I was told that I had CLL.  HPI: Mr. Weigold is a real nice 59 year old white male. He has been in great health. 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 He does not involve himself with the manufacturer of these.  About a week or so ago, he began has some pain over on the left chest wall. He had previously had some rib fractures from several years ago.   He ultimately had some lab work done. Shockingly, he is found to have thrombocytopenia with a platelet count of 31,000. His white cell count was 18.6. Hemoglobin 12.3. MCV was 94. His electrolytes showed a BUN of 27 and creatinine of 1.33 area and his SGOT was 55.  He had lab work repeated. This was done on December 4. This showed a white cell count 17.4. Hemoglobin 11.6. Pletal was 26,000.  There was a review made by a pathologist. They said he has 60% blasts.  We were able to get him into the office today. We were kind enough to been referred Mr. Szabo.  He feels pretty good. He may feel a little tired. He's had no weight loss or weight gain. He's had no bleeding or bruising. He's had no cough. He's had no change in bowel or bladder habits.  He's had no rashes.  He used to smoke but not that much. He really has very rare alcohol use.  There is no family history of blood disorders.  Overall, his performance status is ECOG 0.                   Past Medical History  Diagnosis Date  . Diverticulosis   . Dysrhythmia 07-16-11    '05/'06 issues with rapid heart beat, stress negative.  . Shortness of breath 07-16-11    2 weeks ago-testing found related to gastric tumor with bleeding  . Gastric tumor 07-16-11    dx. 07-08-11, now surgery planned  . Diastolic dysfunction   . Chest pain   . FHx: heart disease     of early heart disease  :  Past Surgical History   Procedure Laterality Date  . Laminotomy  07-16-11    lumbar 4-5 disc surgery  . Eus  07/09/2011    Procedure: UPPER ENDOSCOPIC ULTRASOUND (EUS) LINEAR;  Surgeon: Beryle Beams;  Location: [facility];  Service: Endoscopy;  Laterality: N/A;  . Vasectomy  07-16-11    '89   . Hernia repair  2004    Rt. inguinal hernia repair  . Gastrectomy  07/28/11  . Back surgery  2001    disck  :   Current outpatient prescriptions:  .  predniSONE (DELTASONE) 20 MG tablet, , Disp: , Rfl: 0:  :  No Known Allergies:  Family History  Problem Relation Age of Onset  . Anesthesia problems Neg Hx   . Malignant hyperthermia Neg Hx   . Heart disease Father   . Heart disease Maternal Grandmother   . Cancer Maternal Grandfather     testicular  . Heart disease Maternal Grandfather   . Heart disease Paternal Grandmother   . Heart disease Paternal Grandfather   . Hypertension Mother   . Hypertension Father   . Coronary artery disease Father     with CABG  . Stroke Father     TIA's  . Aortic  aneurysm Father   :  Social History   Social History  . Marital Status: Married    Spouse Name: N/A  . Number of Children: N/A  . Years of Education: N/A   Occupational History  . Not on file.   Social History Main Topics  . Smoking status: Former Smoker -- 1.00 packs/day for 10 years    Types: Cigarettes    Quit date: 07/15/1984  . Smokeless tobacco: Not on file  . Alcohol Use: No  . Drug Use: No  . Sexual Activity: Yes   Other Topics Concern  . Not on file   Social History Narrative  :  Pertinent items are noted in HPI.  Exam: _0 @ Well developed and well-nourished white male in no obvious distress. Vital signs show a temperature of 97.5. Pulse 91. Blood pressure 139/85. Weight is 216 pounds. Head and neck exam shows no ocular or oral lesions. He has no scleral icterus. His conjunctiva are pink. He has no enlarged tonsils. There is no adenopathy in the neck. Lungs are clear to  percussion and auscultation bilaterally. Cardiac exam regular rate and rhythm with no murmurs rubs or bruits. Axillary exam shows no bilateral axillary adenopathy. Abdominal exam is soft. Has good bowel sounds. There is no fluid wave. There is no palpable liver or spleen tip. Back exam shows no tenderness over the spine, ribs or hips. Extremities shows no clubbing, cyanosis or edema. He has good range of motion of his joints. He has good strength in his extremities. Skin exam shows no rashes, ecchymoses or petechia. Neurological exam shows no focal neurological deficits.    Recent Labs  07/02/15 1013  WBC 12.2*  HGB 11.9*  HCT 34.3*  PLT 16*   No results for input(s): NA, K, CL, CO2, GLUCOSE, BUN, CREATININE, CALCIUM in the last 72 hours.  Blood smear review:   normochromic and normocytic population of red blood cells. There are no nucleated red blood cells. There are no teardrop cells. He has no schistocytes or spherocytes. He has no target cells. There is no rouleau formation.  White cells are increased in number. He has blasts. He has nucleoli in the nuclei of these blasts. I do not see any Auer rods. He does have some mature myeloid cells. He has some mature lymphocytes. Platelets are very much decreased in number. Platelets are small. They are fairly granulated.     Pathology: None     Assessment and Plan:  Mr. Schweigert is a 59 year old white male. 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 I would think that this is myeloid leukemia not lymphoid leukemia. I did not see any Auer rods. As such, it is possible that he may have acute lymphoid leukemia.  He does not have chronic leukemia by the blood smear. The fact that his platelet count has been dropping quickly would go against a chronic leukemic process. Typically, the white cells are much higher with chronic leukemia.  I spent an hour with he and his wife. I think they were very surprised by the diagnosis. I told him that  ultimately he will need a bone marrow biopsy to know exactly what the diagnosis is.  I explained to them the nature of acute leukemia. I told him that treatments are inpatient and can often require 4-6 weeks in the hospital. Patients can be hospitalizing longer or shorter depending on the response to treatment.  I explained to them that the prognosis now really depends on the cytogenetics and  the chromosome changes. However, and now, genetic assays are being done back in help categorize acute leukemia into prognostic groups.  They live in with [facility]. As such, I called one of the attendings at [facility] to try to get him over there for therapy. I was told that he will be able to get in tomorrow. I think this is acceptable.  I just feel bad for Mr. Erway. He is very nice. His wife is very nice. I explained to them that I don't think we will find a risk factor as to why he had this. I do think that this just happened. Blood counts were normal back in October 2015. Therefore, we do have a timeframe in which things changed.   I do not think that this is from his job.  I will certainly work closely with [facility] in any manner so that we can get it Mr. Francis through the induction phase of therapy. I would think that he would be a bone marrow transplant candidate given his excellent health.  I answered all their questions.  Hopefully, we will be able to see him back in our office to help out with supportive measures.

## 2015-07-07 LAB — FLOW CYTOMETRY - CHCC SATELLITE

## 2017-08-05 ENCOUNTER — Other Ambulatory Visit: Payer: Self-pay | Admitting: Physician Assistant

## 2017-08-05 DIAGNOSIS — Z7952 Long term (current) use of systemic steroids: Secondary | ICD-10-CM

## 2017-08-05 DIAGNOSIS — R29890 Loss of height: Secondary | ICD-10-CM

## 2017-08-24 ENCOUNTER — Other Ambulatory Visit: Payer: BLUE CROSS/BLUE SHIELD

## 2017-08-29 ENCOUNTER — Ambulatory Visit
Admission: RE | Admit: 2017-08-29 | Discharge: 2017-08-29 | Disposition: A | Discharge status: Home / Self Care | Payer: BLUE CROSS/BLUE SHIELD | Source: Ambulatory Visit | Attending: Physician Assistant | Admitting: Physician Assistant

## 2017-08-29 DIAGNOSIS — Z7952 Long term (current) use of systemic steroids: Secondary | ICD-10-CM

## 2017-08-29 DIAGNOSIS — R29890 Loss of height: Secondary | ICD-10-CM

## 2017-08-30 ENCOUNTER — Other Ambulatory Visit: Payer: BLUE CROSS/BLUE SHIELD

## 2018-08-30 ENCOUNTER — Ambulatory Visit (INDEPENDENT_AMBULATORY_CARE_PROVIDER_SITE_OTHER): Payer: BLUE CROSS/BLUE SHIELD | Admitting: Pulmonary Disease

## 2018-08-30 ENCOUNTER — Encounter: Payer: Self-pay | Admitting: Pulmonary Disease

## 2018-08-30 VITALS — BP 130/70 | HR 75 | Ht 72.0 in | Wt 189.0 lb

## 2018-08-30 DIAGNOSIS — J342 Deviated nasal septum: Secondary | ICD-10-CM

## 2018-08-30 DIAGNOSIS — R05 Cough: Secondary | ICD-10-CM | POA: Diagnosis not present

## 2018-08-30 DIAGNOSIS — R058 Other specified cough: Secondary | ICD-10-CM

## 2018-08-30 MED ORDER — FLUTICASONE PROPIONATE 50 MCG/ACT NA SUSP: 1.0000 | Freq: Every day | NASAL | 2 refills | Status: DC

## 2018-08-30 NOTE — Progress Notes (Signed)
   Subjective:    Patient ID: Jerry Grant, male    DOB: 14-Aug-1955, 63 y.o.   MRN: [id number]  HPI    Review of Systems  Constitutional: Negative.   Eyes: Negative.   Respiratory: Positive for cough.   Cardiovascular: Negative.   Gastrointestinal: Negative.   Endocrine: Negative.   Genitourinary: Negative.   Musculoskeletal: Negative.   Skin: Negative.   Allergic/Immunologic: Negative.   Neurological: Negative.   Hematological: Negative.   Psychiatric/Behavioral: Negative.        Objective:   Physical Exam        Assessment & Plan:

## 2018-08-30 NOTE — Patient Instructions (Signed)
Nasal irrigation nightly Flonase 1 spray in each nostril nightly Use over the counter antihistamine nightly Sip water when you have the urge to cough Use a teaspoon of local honey daily to help alleviate cough Salt water gargles once or twice per day  Follow up in 4 weeks with Dr. Halford Chessman or Nurse Practitioner

## 2018-08-30 NOTE — Progress Notes (Signed)
[facility]  Chief Complaint  Patient presents with  . Consult    referred by Dr. Jimmye Grant for chronic cough x2 years. 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 male former smoker with cough.    He has history of B-cell acute lymphoblastic leukemia.  He had bone marrow transplant in 2017.  He got BMT from his brother who has allergies.  Jerry Grant didn't have allergies before BMT, but developed them afterward.  In February 2018 (about 6 months after BMT) he had the flu.  Since then he has intermittent cough.  He will get sinus drainage and scratchy throat.  He has to clear his throat sometimes also.  He feels like he has more trouble breathing out of his left nostril.  He hasn't done allergy testing before.  He doesn't have trouble around animals.  He quit smoking years ago.  He has tried OTC antihistamines and flonase, and his cough improves some.  He hasn't been using these recently.  He was tried on advair and antireflux therapy, both w/o improvement.  He is not having wheeze, chest pain or tightness, sputum, fever, hemoptysis, gland swelling, or shortness of breath with walking.  He does get dry skin during the winter months.  Chest xray and PFT from January 05, 2018 were normal.   Physical Exam:   Appearance - well kempt   ENMT - clear nasal mucosa, deviated nasal  septum, no oral exudates, no LAN, trachea midline, MP 3, elongated uvula  Respiratory - normal chest wall, normal respiratory effort, no accessory muscle use, no wheeze/rales  CV - s1s2 regular rate and rhythm, no  murmurs, no peripheral edema, radial pulses symmetric  GI - soft, non tender, no masses  Lymph - no adenopathy noted in neck and axillary areas  MSK - normal gait  Ext - no cyanosis, clubbing, or joint inflammation noted  Skin - no rashes, lesions, or ulcers  Neuro - normal strength, oriented x 3  Psych - normal mood and affect  Discussion:  He likely developed predisposition to seasonal allergies after bone marrow transplant.  He also has deviated nasal septum.  These are likely contributing to upper airway cough syndrome with post nasal drip.  His symptom description is not suggestive of asthma, or reflux as a cause of his cough.  He had previous chest imaging studies and pulmonary function testing that were unremarkable.  Assessment/Plan:   Upper airway cough syndrome. - nasal irrigation nightly - flonase 1 spray in each nostril nightly - he is to resume OTC antihistamine nightly - Sip water when you have the urge to cough - Use a teaspoon of local honey daily to help alleviate cough - Salt water gargles once or twice per day - if his cough persists at follow up, then he will need repeat PFT and chest imaging studies with high resolution CT chest  Deviated nasal septum. - if symptoms progress, then he might need further assessment with ENT   Patient Instructions  Nasal irrigation nightly Flonase  1 spray in each nostril nightly Use over the counter antihistamine nightly Sip water when you have the urge to cough Use a teaspoon of local honey daily to help alleviate cough Salt water gargles once or twice per day  Follow up in 4 weeks with Dr. Halford Grant or Nurse Practitioner    Jerry Mires, MD [place] Pager: [phone] 08/30/2018, 4:51 PM  Flow Sheet     Pulmonary tests:  PFT 01/05/18 >>FEV1 3.37 (94%), FEV1% 74, TLC 7.50 (104%), DLCO 88%  Chest imaging:  CT chest 09/21/16 >> micronodularity and GGO in Lt and Rt lungs likely from viral  pneumonia  Cardiac tests:  Echo 01/24/17 >> EF 60 to 65%   Review of Systems:  Constitutional: Negative.   Eyes: Negative.   Respiratory: Positive for cough.   Cardiovascular: Negative.   Gastrointestinal: Negative.   Endocrine: Negative.   Genitourinary: Negative.   Musculoskeletal: Negative.   Skin: Negative.   Allergic/Immunologic: Negative.   Neurological: Negative.   Hematological: Negative.   Psychiatric/Behavioral: Negative.    Medications:   Allergies as of 08/30/2018   No Known Allergies     Medication List       Accurate as of August 30, 2018  4:51 PM. Always use your most recent med list.        fluticasone 50 MCG/ACT nasal spray Commonly known as:  FLONASE Place 1 spray into both nostrils daily.       Past Surgical History:  He  has a past surgical history that includes Laminotomy (07-16-11); EUS (07/09/2011); Vasectomy (07-16-11); Hernia repair (2004); Gastrectomy (07/28/11); and Back surgery (2001).  Family History:  His family history includes Aortic aneurysm in his father; Cancer in his maternal grandfather; Coronary artery disease in his father; Heart disease in his father, maternal grandfather, maternal grandmother, paternal grandfather, and paternal grandmother; Hypertension in his father and mother; Stroke in his father.  Social History:  He  reports that he quit smoking about 34 years ago. His smoking use included cigarettes. He has a 10.00 pack-year smoking history. He has never used smokeless tobacco. He reports that he does not drink alcohol or use drugs.

## 2018-09-26 ENCOUNTER — Other Ambulatory Visit: Payer: Self-pay | Admitting: Physician Assistant

## 2018-09-26 DIAGNOSIS — R310 Gross hematuria: Secondary | ICD-10-CM

## 2018-09-26 DIAGNOSIS — R109 Unspecified abdominal pain: Secondary | ICD-10-CM

## 2018-09-26 DIAGNOSIS — R39198 Other difficulties with micturition: Secondary | ICD-10-CM

## 2018-09-29 ENCOUNTER — Ambulatory Visit
Admission: RE | Admit: 2018-09-29 | Discharge: 2018-09-29 | Disposition: A | Discharge status: Home / Self Care | Payer: BLUE CROSS/BLUE SHIELD | Source: Ambulatory Visit | Attending: Physician Assistant | Admitting: Physician Assistant

## 2018-09-29 DIAGNOSIS — R310 Gross hematuria: Secondary | ICD-10-CM

## 2018-09-29 DIAGNOSIS — R39198 Other difficulties with micturition: Secondary | ICD-10-CM

## 2018-09-29 DIAGNOSIS — R109 Unspecified abdominal pain: Secondary | ICD-10-CM

## 2018-10-03 ENCOUNTER — Encounter: Payer: Self-pay | Admitting: Adult Health

## 2018-10-03 ENCOUNTER — Ambulatory Visit (INDEPENDENT_AMBULATORY_CARE_PROVIDER_SITE_OTHER): Payer: BLUE CROSS/BLUE SHIELD | Admitting: Adult Health

## 2018-10-03 DIAGNOSIS — R9389 Abnormal findings on diagnostic imaging of other specified body structures: Secondary | ICD-10-CM

## 2018-10-03 DIAGNOSIS — R058 Other specified cough: Secondary | ICD-10-CM

## 2018-10-03 DIAGNOSIS — R059 Cough, unspecified: Secondary | ICD-10-CM

## 2018-10-03 DIAGNOSIS — R05 Cough: Secondary | ICD-10-CM | POA: Diagnosis not present

## 2018-10-03 MED ORDER — BENZONATATE 200 MG PO CAPS: 200.0000 mg | ORAL_CAPSULE | Freq: Three times a day (TID) | ORAL | 3 refills | Status: DC | PRN

## 2018-10-03 NOTE — Patient Instructions (Addendum)
Finish ZPack as directed  Delsym 2 tsp Twice daily  For cough As needed   Tessalon Three times a day  For cough As needed   Saline nasal rinses Twice daily   Flonase 1 spray in each nostril nightly Claritin 10mg  in am .  Add Clortrimeton 4mg  At bedtime   Add Pepcid 20mg  At bedtime   Sip water when you have the urge to cough Use a teaspoon of local honey daily to help alleviate cough Set up for HRCT chest in 6-8  weeks .  Follow up with Dr. Halford Chessman  Or Parrett NP in 6 -8 weeks with PFT . And As needed

## 2018-10-03 NOTE — Assessment & Plan Note (Addendum)
CT Abd/pelvis showed GGO in lung bases , will need further evaluation with dedicated CT chest .  Will have him finish abx and check CT chest in 6  Weeks.  Check PFT on return

## 2018-10-03 NOTE — Assessment & Plan Note (Signed)
Chronic cough since 2018 ? Etiology  May be aggravated by rhinitis , possible GERD  Will need to control for triggers and cough control regimen  Have him finish abx course , check CT chest in 6-8 weeks   Plan  Patient Instructions  Finish ZPack as directed  Delsym 2 tsp Twice daily  For cough As needed   Tessalon Three times a day  For cough As needed   Saline nasal rinses Twice daily   Flonase 1 spray in each nostril nightly Claritin 10mg  in am .  Add Clortrimeton 4mg  At bedtime   Add Pepcid 20mg  At bedtime   Sip water when you have the urge to cough Use a teaspoon of local honey daily to help alleviate cough Set up for HRCT chest in 6-8  weeks .  Follow up with Dr. Halford Chessman  Or Allyce Bochicchio NP in 6 -8 weeks with PFT . And As needed

## 2018-10-03 NOTE — Progress Notes (Signed)
@Patient  ID: Jerry Grant, male    DOB: 14-Jul-1956, 63 y.o.   MRN: [id number]  Chief Complaint  Patient presents with  . Follow-up    Cough     Referring provider: Stephens Shire, MD  HPI: 63 year old male former smoker seen for pulmonary consult August 30, 2018 for chronic cough x2 years. 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  Patient was seen last visit for pulmonary consult for ongoing cough over the last 2 years.  Patient has a significant medical history for B cell acute lymphoblastic leukemia which was diagnosed in 2016.  He was treated with chemo and underwent a bone marrow transplant in 2017.  He did have a PICC line bacteremia in 2017 influenza pneumonia 2018.  Patient says during this timeframe around 2018 he developed a cough that would not go away.  He has had multiple treatments including GERD and allergy medications.  Describes the cough is dry minimally productive.  Last visit patient was recommended to begin Flonase and Claritin.  He says he is feeling better.  Feels that his cough is about 70% decrease.  Since last visit patient was found to have kidney stones.  CT abdomen and pelvis also showed basilar groundglass opacities.  Along this time he also had an increased cough with production.  His primary care provider started him on a Z-Pak he has a few days left of this.  Patient does complain of postnasal drainage and frequent throat clearing.  Denies any significant GERD.  Is not using any medications for cough.  He denies any  hemoptysis, chest pain, orthopnea or dyspnea.   No Known Allergies  Immunization History  Administered Date(s) Administered  . Influenza Split 05/26/2018  . Influenza,inj,Quad PF,6+ Mos 06/28/2015  . Pneumococcal Polysaccharide-23 10/03/2015    Past Medical History:  Diagnosis Date  . Chest pain   . Diastolic dysfunction   . Diverticulosis   . Dysrhythmia 07-16-11   '05/'06 issues with rapid heart beat, stress negative.  Marland Kitchen FHx: heart disease    of early heart disease  . 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 Shortness of breath 07-16-11   2 weeks ago-testing found related to gastric tumor with bleeding    Tobacco History: Social History   Tobacco Use  Smoking Status Former Smoker  . Packs/day: 1.00  . Years: 10.00  . Pack years: 10.00  . Types: Cigarettes  . Last attempt to quit: 07/15/1984  . Years since quitting: 34.2  Smokeless Tobacco Never Used   Counseling given: Not Answered   Outpatient Medications Prior to Visit  Medication Sig Dispense Refill  . fluticasone (FLONASE) 50 MCG/ACT nasal spray Place 1 spray into both nostrils daily. 16 g 2  . tamsulosin (FLOMAX) 0.4 MG CAPS capsule Take 0.4 mg by mouth daily after supper. For kidney stone     No facility-administered medications prior to visit.      Review of Systems:   Constitutional:   No  weight loss, night sweats,  Fevers, chills, fatigue, or  lassitude.  HEENT:   No headaches,  Difficulty swallowing,  Tooth/dental problems, or  Sore throat,                No sneezing, itching, ear ache, + nasal congestion, post nasal drip,   CV:  No chest pain,  Orthopnea, PND, swelling in lower extremities, anasarca, dizziness, palpitations, syncope.   GI  No heartburn, indigestion, abdominal pain, nausea, vomiting, diarrhea, change in bowel habits, loss of appetite, bloody stools.   Resp:   No chest wall deformity  Skin: no rash or lesions.  GU: no dysuria, change in color of  urine, no urgency or frequency.  No flank pain, no hematuria   MS:  No joint pain or swelling.  No decreased range of motion.  No back pain.    Physical Exam  BP 124/80 (BP Location: Left Arm, Cuff Size: Normal)   Pulse 86   Ht 6\' 1"  (1.854 m)   Wt 192 lb 12.8 oz (87.5 kg)   SpO2 95%   BMI 25.44 kg/m   GEN: A/Ox3; pleasant , NAD   HEENT:  [place]/AT,  EACs-clear, TMs-wnl, NOSE-clear, THROAT-clear, no lesions, no postnasal drip or exudate noted.   NECK:  Supple w/ fair ROM; no JVD; normal carotid impulses w/o bruits; no thyromegaly or nodules palpated; no lymphadenopathy.    RESP  Clear  P & A; w/o, wheezes/ rales/ or rhonchi. no accessory muscle use, no dullness to percussion  CARD:  RRR, no m/r/g, no peripheral edema, pulses intact, no cyanosis or clubbing.  GI:   Soft & nt; nml bowel sounds; no organomegaly or masses detected.   Musco: Warm bil, no deformities or joint swelling noted.   Neuro: alert, no focal deficits noted.    Skin: Warm, no lesions or rashes    Lab Results:  BNP No results found for: BNP  ProBNP No results found for: PROBNP  Imaging: Ct Abdomen Pelvis Wo Contrast  Result Date: 09/30/2018 CLINICAL DATA:  Left flank pain. EXAM: CT ABDOMEN AND PELVIS WITHOUT CONTRAST TECHNIQUE: Multidetector CT imaging of the abdomen and pelvis was performed following the standard protocol without IV contrast. COMPARISON:  None. FINDINGS: Lower chest: Tree-in-bud opacities in the right base on series 4, image 3. Ground-glass opacity posteriorly in the left base such as on series 4, image 6. No other suspicious abnormalities in the lung bases. Hepatobiliary: No focal liver abnormality is seen. No gallstones, gallbladder wall thickening, or biliary dilatation. Pancreas: Unremarkable. No pancreatic ductal dilatation or surrounding inflammatory changes. Spleen: Normal in size without focal abnormality. Adrenals/Urinary Tract: There is a 4 mm stone in the lower pole the left  kidney. No hydronephrosis on the right. No hydronephrosis on the left. There is a cyst in the left kidney. No other masses. Right ureter is normal with no stones. No left ureteral stones. Small bladder diverticulum on the right on series 2, image 73. Bladder is otherwise unremarkable. Stomach/Bowel: Stomach and small bowel are normal. The colon demonstrates diverticulosis without diverticulitis. The remainder of the colon is normal. The appendix is normal. Vascular/Lymphatic: The abdominal aorta demonstrates no aneurysmal dilatation. Minimal atherosclerotic changes are noted. No adenopathy. Reproductive: Prostate is unremarkable. Other: No abdominal wall hernia or abnormality. No abdominopelvic ascites. Musculoskeletal: No acute or significant osseous findings. IMPRESSION: 1. Mild tree-in-bud opacities in the right base and ground-glass opacity posteriorly on the left is suspicious for a subtle infectious or inflammatory process. Recommend follow-up to resolution. 2. Nonobstructive 4 mm stone in the lower  left kidney. 3. Small bladder diverticulum on the right. 4. Colonic diverticulosis without diverticulitis. 5. Atherosclerotic changes in the abdominal aorta, mild. Electronically Signed   By: Dorise Bullion III M.D   On: 09/30/2018 23:12      No flowsheet data found.  No results found for: NITRICOXIDE      Assessment & Plan:   No problem-specific Assessment & Plan notes found for this encounter.     Rexene Edison, NP 10/03/2018

## 2018-10-13 ENCOUNTER — Telehealth: Payer: Self-pay | Admitting: Adult Health

## 2018-10-13 NOTE — Telephone Encounter (Signed)
Routed per your request

## 2018-10-13 NOTE — Telephone Encounter (Signed)
Called and spoke with the patient's wife to advised he of the response below and need for OV if symptoms do not improve. She voiced understanding. Nothing further needed at this time.

## 2018-10-13 NOTE — Telephone Encounter (Signed)
It is possible the tessalon could cause this but typically not an issue  Would push fluids .  If not improving will need to check PCP for dizziness could be representative of other etiology .  Typically not seen with pepcid or delsym but if continues could take them off as well one by one to see if medication related.   Please contact office for sooner follow up if symptoms do not improve or worsen or seek emergency care

## 2018-10-13 NOTE — Telephone Encounter (Signed)
Primary Pulmonologist: Sood Last office visit and with whom: Tammy Parrett, 10/03/2018 What do we see them for (pulmonary problems): Upper airway cough syndrome Last OV assessment/plan:Chronic cough since 2018 ? Etiology  May be aggravated by rhinitis , possible GERD  Will need to control for triggers and cough control regimen  Have him finish abx course , check CT chest in 6-8 weeks    Plan  Patient Instructions  Finish ZPack as directed  Delsym 2 tsp Twice daily  For cough As needed   Tessalon Three times a day  For cough As needed   Saline nasal rinses Twice daily   Flonase 1 spray in each nostril nightly Claritin 10mg  in am .  Add Clortrimeton 4mg  At bedtime   Add Pepcid 20mg  At bedtime   Sip water when you have the urge to cough Use a teaspoon of local honey daily to help alleviate cough Set up for HRCT chest in 6-8  weeks .  Follow up with Dr. Halford Chessman  Or Parrett NP in 6 -8 weeks with PFT . And As needed         Was appointment offered to patient (explain)?  Pt wants recs over the phone- he is working today   Reason for call: Spoke with the pt (his spouse not on DPR) He c/o feeling dizzy and light headed off and on for the past 2 days. He has felt clammy a few times also but denies fever or other symptoms. He states he is taking all of the above meds listed above expect for the zpack bc he completed this and the chlortrimeton bc pharm did not have this available. He is asking if any of the meds we rec could be causing these side effects. He has taken pseudofed in the past and had the same type of symtoms. Please advise, thanks!  No Known Allergies

## 2018-11-01 ENCOUNTER — Other Ambulatory Visit: Payer: BLUE CROSS/BLUE SHIELD

## 2018-11-14 ENCOUNTER — Ambulatory Visit: Payer: BLUE CROSS/BLUE SHIELD | Admitting: Adult Health

## 2018-12-15 ENCOUNTER — Telehealth: Payer: Self-pay | Admitting: *Deleted

## 2018-12-15 NOTE — Telephone Encounter (Signed)
Script Screening patients for COVID-19 and reviewing new operational procedures  Greeting - The reason I am calling is to share with you some new changes to our processes that are designed to help [place] keep everyone safe. Is now a good time to speak with you?  Patient says "no' - ask them when you can call back and let them know it's important to do this prior to their appointment.  Patient says "yes" - Great, Flonnie Overman) the first thing I need to do is ask you some screening Questions.  1. To the best of your knowledge, have you been in close contact with any one with a confirmed diagnosis of COVID 19? o No - proceed to next question   2. Have you had any one or more of the following: fever, chills, cough, shortness of breath or any flu-like symptoms? o No - proceed to next question  3. Have you been diagnosed with or have a previous diagnosis of COVID 19? o No - proceed to next question  4. I am going to go over a few other symptoms with you. Please let me know if you are experiencing any of the following: . Ear, nose or throat discomfort . A sore throat . Headache . Muscle pain . Diarrhea . Loss of taste or smell o No - proceed to next question  Thank you for answering these questions. Please know we will ask you these questions or similar questions when you arrive for your appointment and again it's how we are keeping everyone safe. Also, to keep you safe, please use the provided hand sanitizer when you enter the building. Flonnie Overman), we are asking everyone in the building to wear a mask because they help [place] prevent the spread of germs. Do you have a mask of your own, if not, we are happy to provide one for you. The last thing I want to go over with you is the no visitor guidelines. This means no one can attend the appointment with you unless you need physical assistance. I understand this may be different from your past appointments and I know this may be difficult  but please know if someone is driving you we are happy to call them for you once your appointment is over.  [facility] SITE SPECIFIC CHECK IN PROCEDURES]  Flonnie Overman I've given you a lot of information, what questions do you have about what I've talked about today or your appointment tomorrow?

## 2018-12-19 ENCOUNTER — Ambulatory Visit (INDEPENDENT_AMBULATORY_CARE_PROVIDER_SITE_OTHER)
Admission: RE | Admit: 2018-12-19 | Discharge: 2018-12-19 | Disposition: A | Discharge status: Home / Self Care | Payer: BLUE CROSS/BLUE SHIELD | Source: Ambulatory Visit | Attending: Adult Health | Admitting: Adult Health

## 2018-12-19 ENCOUNTER — Other Ambulatory Visit: Payer: Self-pay

## 2018-12-19 DIAGNOSIS — R05 Cough: Secondary | ICD-10-CM | POA: Diagnosis not present

## 2018-12-19 DIAGNOSIS — R059 Cough, unspecified: Secondary | ICD-10-CM

## 2018-12-26 ENCOUNTER — Telehealth: Payer: Self-pay | Admitting: Adult Health

## 2018-12-26 DIAGNOSIS — R059 Cough, unspecified: Secondary | ICD-10-CM

## 2018-12-26 DIAGNOSIS — R05 Cough: Secondary | ICD-10-CM

## 2018-12-26 NOTE — Progress Notes (Signed)
LMOMTCB x 1 

## 2018-12-26 NOTE — Telephone Encounter (Signed)
That is great , will look at PFT then  Glad the dates work out .

## 2018-12-26 NOTE — Telephone Encounter (Signed)
Notes recorded by Rinaldo Ratel, CMA on 12/26/2018 at 2:38 PM EDT LMOM TCB x1. ------ Notes recorded by Melvenia Needles, NP on 12/20/2018 at 12:55 PM EDT Also noted Atherosclerosis and 4.3 cm ectatti c Thoracic aorta. Will need to discuss with PCP if further testing is indicated. -incidental finding ------ Notes recorded by Melvenia Needles, NP on 12/20/2018 at 12:52 PM EDT CT shows some areas of mild bronchiectasis , and opacities that could be contributing to cough  If he could cough up a sputum cx that would be great - send for Sputum Cx and Sputum AFB  However if not will discuss in more detail on return office visit with PFT . Might need to consider a bronchoscopy if cough persists  Please set up OV with Dr. Halford Chessman With PFT in 1 month (will need COVID screen prior to visit and /PFT )   Please contact office for sooner follow up if symptoms do not improve or worsen or seek emergency care      Called and spoke with pt letting him know the results of the CT per TP and that some areas show mild bronchiectasis which could be contributing to the cough. Stated to him that TP would like him to produce sputum for culture so we can see if that shows anything so I stated to pt that I was going to place specimen cup for the culture up front for him with all the instructions on what needs to be done to produce the sample and pt verbalized understanding. Also stated to pt that TP wants pt to have PFT performed and based on that and if the cough persists might need to consider a bronchoscopy. While stating to pt PFT, pt stated to me he currently has an appt scheduled at [facility] to have PFT performed 7/2 which pt stated this PFT appt is unrelated to the current issues seen on CT.  Asked pt to see if he could be provided a hard copy of the PFT results from his appt 7/2 so that way when he comes to office 7/13 to see TP, she can look at the PFT results at his OV with her and pt verbalized  understanding.   Routing to TP as an FYI so she knows that pt is already going to have PFT performed.

## 2018-12-29 ENCOUNTER — Other Ambulatory Visit: Payer: BLUE CROSS/BLUE SHIELD

## 2018-12-29 DIAGNOSIS — R05 Cough: Secondary | ICD-10-CM

## 2018-12-29 DIAGNOSIS — R059 Cough, unspecified: Secondary | ICD-10-CM

## 2018-12-29 NOTE — Addendum Note (Signed)
Addended by: Suzzanne Cloud E on: 12/29/2018 09:06 AM   Modules accepted: Orders

## 2019-01-11 NOTE — Progress Notes (Signed)
Reviewed and agree with assessment/plan.   Gwenna Fuston, MD [facility] 07/21/2016, 12:24 PM Pager:  [phone]  

## 2019-02-05 ENCOUNTER — Ambulatory Visit: Payer: BLUE CROSS/BLUE SHIELD | Admitting: Adult Health

## 2019-02-06 ENCOUNTER — Encounter: Payer: Self-pay | Admitting: Adult Health

## 2019-02-06 ENCOUNTER — Ambulatory Visit (INDEPENDENT_AMBULATORY_CARE_PROVIDER_SITE_OTHER): Payer: BC Managed Care – PPO | Admitting: Adult Health

## 2019-02-06 ENCOUNTER — Other Ambulatory Visit: Payer: Self-pay

## 2019-02-06 VITALS — BP 140/76 | HR 64 | Temp 98.3°F | Ht 74.0 in | Wt 194.0 lb

## 2019-02-06 DIAGNOSIS — J479 Bronchiectasis, uncomplicated: Secondary | ICD-10-CM | POA: Diagnosis not present

## 2019-02-06 DIAGNOSIS — R05 Cough: Secondary | ICD-10-CM | POA: Diagnosis not present

## 2019-02-06 DIAGNOSIS — R058 Other specified cough: Secondary | ICD-10-CM

## 2019-02-06 NOTE — Progress Notes (Signed)
@Patient  ID: Jerry Grant, male    DOB: 04/24/1956, 63 y.o.   MRN: [id number]  Chief Complaint  Patient presents with  . Follow-up    Cough     Referring provider: Heywood Bene, *  HPI: 63 year old male former smoker seen for pulmonary consult August 30, 2018 for chronic cough x2 years. 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  Patient says since last visit he is doing very well.  He has no significant cough at all.  Does have some mild sinus drainage at times.  Patient has a history of B cell acute lymphoblastic leukemia which diagnosed in 2016.  He underwent chemo and a bone marrow transplant in 2017.  Says he has been doing exceptionally well and is actually been released from oncology. During this time.  He had a PICC line bacteremia 2017 and influenza pneumonia in 2018.  Says since then he has been doing well.  Cough seem to be irritated by postnasal drainage.  Symptoms improved with some Claritin and Flonase.  A high-resolution CT chest was done Dec 19, 2018 that showed Minimal cylindrical bronchiectasis with associated mild patchy tree-in-bud opacity in the bilateral lower lung lobes. Findings raise the possibility of atypical mycobacterial infection (MAI) or recurrent aspiration. Sputum AFB last month was negative.  patient says he did not have a great sputum specimen. Patient denies any hemoptysis, weight loss.,  Increased fatigue.  Has minimum cough.  No Known Allergies  Immunization History  Administered Date(s)  Administered  . Influenza Split 05/26/2018  . Influenza,inj,Quad PF,6+ Mos 06/28/2015  . Pneumococcal Polysaccharide-23 10/03/2015    Past Medical History:  Diagnosis Date  . Chest pain   . Diastolic dysfunction   . Diverticulosis   . Dysrhythmia 07-16-11   '05/'06 issues with rapid heart beat, stress negative.  Marland Kitchen FHx: heart disease    of early heart disease  . 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 Shortness of breath 07-16-11   2 weeks ago-testing found related to gastric tumor with bleeding    Tobacco History: Social History   Tobacco Use  Smoking Status Former Smoker  . Packs/day: 1.00  . Years: 10.00  . Pack years: 10.00  . Types: Cigarettes  . Quit date: 07/15/1984  . Years since quitting: 34.5  Smokeless Tobacco Never Used   Counseling given: Not Answered   Outpatient Medications Prior to Visit  Medication Sig Dispense Refill  . sodium chloride (OCEAN) 0.65 % SOLN nasal spray Place 1 spray into both nostrils as needed for congestion.    . tamsulosin (FLOMAX) 0.4 MG CAPS capsule Take 0.4 mg by mouth daily after supper. For kidney stone    . benzonatate (TESSALON) 200 MG capsule Take 1 capsule (200 mg total) by mouth 3 (three) times daily as needed for cough. (Patient not taking: Reported on 02/06/2019) 45 capsule 3  . fluticasone (FLONASE) 50 MCG/ACT nasal spray Place 1 spray into both nostrils daily. (Patient not taking: Reported on 02/06/2019) 16 g 2   No  facility-administered medications prior to visit.      Review of Systems:   Constitutional:   No  weight loss, night sweats,  Fevers, chills, fatigue, or  lassitude.  HEENT:   No headaches,  Difficulty swallowing,  Tooth/dental problems, or  Sore throat,                No sneezing, itching, ear ache, nasal congestion, post nasal drip,   CV:  No chest pain,  Orthopnea, PND, swelling in lower extremities, anasarca, dizziness, palpitations, syncope.   GI  No heartburn, indigestion,  abdominal pain, nausea, vomiting, diarrhea, change in bowel habits, loss of appetite, bloody stools.   Resp: No shortness of breath with exertion or at rest.  No excess mucus, no productive cough,  No non-productive cough,  No coughing up of blood.  No change in color of mucus.  No wheezing.  No chest wall deformity  Skin: no rash or lesions.  GU: no dysuria, change in color of urine, no urgency or frequency.  No flank pain, no hematuria   MS:  No joint pain or swelling.  No decreased range of motion.  No back pain.    Physical Exam  BP 140/76 (BP Location: Left Arm, Cuff Size: Normal)   Pulse 64   Temp 98.3 F (36.8 C) (Oral)   Ht 6\' 2"  (1.88 m)   Wt 194 lb (88 kg)   SpO2 98%   BMI 24.91 kg/m   GEN: A/Ox3; pleasant , NAD, well nourished    HEENT:  /AT,  EACs-clear, TMs-wnl, NOSE-clear, THROAT-clear, no lesions, no postnasal drip or exudate noted.   NECK:  Supple w/ fair ROM; no JVD; normal carotid impulses w/o bruits; no thyromegaly or nodules palpated; no lymphadenopathy.    RESP  Clear  P & A; w/o, wheezes/ rales/ or rhonchi. no accessory muscle use, no dullness to percussion  CARD:  RRR, no m/r/g, no peripheral edema, pulses intact, no cyanosis or clubbing.  GI:   Soft & nt; nml bowel sounds; no organomegaly or masses detected.   Musco: Warm bil, no deformities or joint swelling noted.   Neuro: alert, no focal deficits noted.    Skin: Warm, no lesions or rashes    Lab Results:   BMET  BNP No results found for: BNP  ProBNP No results found for: PROBNP  Imaging: No results found.    No flowsheet data found.  No results found for: NITRICOXIDE      Assessment & Plan:   No problem-specific Assessment & Plan notes found for this encounter.     Rexene Edison, NP 02/06/2019

## 2019-02-06 NOTE — Patient Instructions (Signed)
Delsym 2 tsp Twice daily  For cough As needed   Tessalon Three times a day  For cough As needed   Saline nasal rinses Twice daily   Flonase 1 spray in each nostril nightly As needed  Drainage .  Claritin 10mg  in am As needed  Drainage .  NO Mints.  Sip water when you have the urge to cough Sputum culture and sputum AFB .  Follow up with Dr. Halford Chessman  Or Chela Sutphen NP in 6 months and As needed

## 2019-02-07 DIAGNOSIS — J479 Bronchiectasis, uncomplicated: Secondary | ICD-10-CM | POA: Insufficient documentation

## 2019-02-07 NOTE — Assessment & Plan Note (Signed)
Appears very mild.  Patient has no pulmonary limiting factors.  No active cough at present time.  We will continue to monitor closely control for triggers  Plan  Patient Instructions  Delsym 2 tsp Twice daily  For cough As needed   Tessalon Three times a day  For cough As needed   Saline nasal rinses Twice daily   Flonase 1 spray in each nostril nightly As needed  Drainage .  Claritin 10mg  in am As needed  Drainage .  NO Mints.  Sip water when you have the urge to cough Sputum culture and sputum AFB .  Follow up with Dr. Halford Chessman  Or Chauncy Mangiaracina NP in 6 months and As needed

## 2019-02-07 NOTE — Assessment & Plan Note (Signed)
Patient does have some underlying bronchiectasis and mild tree-in-bud opacities in the lower lobes.  This could be some underlying MAI however patient has no significant active symptoms.  And sputum AFB was negative.  Did advise patient if he gets like a good sputum specimen can repeat a sputum culture and a sputum AFB.  Plan  Patient Instructions  Delsym 2 tsp Twice daily  For cough As needed   Tessalon Three times a day  For cough As needed   Saline nasal rinses Twice daily   Flonase 1 spray in each nostril nightly As needed  Drainage .  Claritin 10mg  in am As needed  Drainage .  NO Mints.  Sip water when you have the urge to cough Sputum culture and sputum AFB .  Follow up with Dr. Halford Chessman  Or Nyiesha Beever NP in 6 months and As needed

## 2019-02-17 LAB — AFB CULTURE WITH SMEAR (NOT AT ARMC)
Acid Fast Culture: NEGATIVE
Acid Fast Smear: NEGATIVE

## 2019-02-19 ENCOUNTER — Telehealth: Payer: Self-pay | Admitting: Adult Health

## 2019-02-19 ENCOUNTER — Other Ambulatory Visit: Payer: Self-pay

## 2019-02-19 DIAGNOSIS — T7632XA Child psychological abuse, suspected, initial encounter: Secondary | ICD-10-CM

## 2019-02-19 NOTE — Telephone Encounter (Signed)
  Advised pt of results. Pt understood and nothing further is needed.    Notes recorded by Melvenia Needles, NP on 02/19/2019 at 7:34 AM EDT  Final cx is negative.  Keep follow up and will discuss further  Please contact office for sooner follow up if symptoms do not improve or worsen or seek emergency care   ------

## 2019-02-20 LAB — NOVEL CORONAVIRUS, NAA: SARS-CoV-2, NAA: NOT DETECTED

## 2019-02-21 ENCOUNTER — Telehealth: Payer: Self-pay | Admitting: Adult Health

## 2019-02-21 NOTE — Telephone Encounter (Signed)
Pt has already received cx results. Nothing further needed. LM to disregard.

## 2019-08-09 ENCOUNTER — Other Ambulatory Visit: Payer: Self-pay

## 2019-08-09 ENCOUNTER — Encounter: Payer: Self-pay | Admitting: Adult Health

## 2019-08-09 ENCOUNTER — Ambulatory Visit (INDEPENDENT_AMBULATORY_CARE_PROVIDER_SITE_OTHER): Payer: BC Managed Care – PPO | Admitting: Adult Health

## 2019-08-09 DIAGNOSIS — R058 Other specified cough: Secondary | ICD-10-CM

## 2019-08-09 DIAGNOSIS — R05 Cough: Secondary | ICD-10-CM | POA: Diagnosis not present

## 2019-08-09 DIAGNOSIS — J479 Bronchiectasis, uncomplicated: Secondary | ICD-10-CM | POA: Diagnosis not present

## 2019-08-09 NOTE — Assessment & Plan Note (Signed)
Stable on current regimen.  Continue with pulmonary hygiene regimen.  Control triggers  Plan  Patient Instructions  Delsym 2 tsp Twice daily  For cough As needed   Tessalon Three times a day  For cough As needed   Saline nasal rinses Twice daily   Flonase 1 spray in each nostril nightly As needed  Drainage .  Claritin 10mg  in am As needed  Drainage .  GERD diet.  Follow up with Dr. Halford Chessman  Or Taquan Bralley NP in 6 months and As needed.

## 2019-08-09 NOTE — Patient Instructions (Addendum)
Delsym 2 tsp Twice daily  For cough As needed   Tessalon Three times a day  For cough As needed   Saline nasal rinses Twice daily   Flonase 1 spray in each nostril nightly As needed  Drainage .  Claritin 10mg  in am As needed  Drainage .  GERD diet.  Follow up with Dr. Halford Chessman  Or Aavya Shafer NP in 6 months and As needed.

## 2019-08-09 NOTE — Assessment & Plan Note (Signed)
Control for triggers.  Plan  Patient Instructions  Delsym 2 tsp Twice daily  For cough As needed   Tessalon Three times a day  For cough As needed   Saline nasal rinses Twice daily   Flonase 1 spray in each nostril nightly As needed  Drainage .  Claritin 10mg  in am As needed  Drainage .  GERD diet.  Follow up with Dr. Halford Chessman  Or Par[place] NP in 6 months and As needed.

## 2019-08-09 NOTE — Progress Notes (Signed)
--*  @Patient  ID: Jerry Grant, male    DOB: Apr 14, 1956, 64 y.o.   MRN: [id number]  Chief Complaint  Patient presents with  . Follow-up    cough    Referring provider: Heywood Grant, *  HPI: 64 year old male former smoker seen for pulmonary consult August 30, 2018 for chronic cough x2 years. 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  Ectatic 4.3 ascending thoracic aorta.  Sputum AFB June 2020 -  08/09/2019 Follow up : Cough , Bronchiectasis  Patient presents for a 47-month follow-up.  Patient has underlying bronchiectasis and chronic cough.  He says overall he is doing well.  Says the cough is been under good control.  He says it kind of comes and goes.  But he has some medications including Delsym Tessalon at home that he uses he also uses Flonase and Claritin if needed.  He says mainly his cough seems to be worse at night but is manageable.  He denies any chest pain orthopnea PND or increased leg swelling.  No hemoptysis or weight loss.   No Known Allergies  Immunization History  Administered Date(s) Administered  . Influenza Split 05/26/2018  . Influenza,inj,Quad PF,6+ Mos 06/28/2015  . Influenza,inj,Quad PF,6-35 Mos 04/26/2019  . Pneumococcal Polysaccharide-23 10/03/2015    Past Medical History:  Diagnosis Date  . Chest pain   . Diastolic dysfunction   . Diverticulosis   . Dysrhythmia 07-16-11   '05/'06 issues with rapid heart beat, stress negative.  Marland Kitchen FHx: heart disease     of early heart disease  . 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 Shortness of breath 07-16-11   2 weeks ago-testing found related to gastric tumor with bleeding    Tobacco History: Social History   Tobacco Use  Smoking Status Former Smoker  . Packs/day: 1.00  . Years: 10.00  . Pack years: 10.00  . Types: Cigarettes  . Quit date: 07/15/1984  . Years since quitting: 35.0  Smokeless Tobacco Never Used   Counseling given: Not Answered   Outpatient Medications Prior to Visit  Medication Sig Dispense Refill  . sodium chloride (OCEAN) 0.65 % SOLN nasal spray Place 1 spray into both nostrils as needed for congestion.    . benzonatate (TESSALON) 200 MG capsule Take 1 capsule (200 mg total) by mouth 3 (three) times daily as needed for cough. (Patient not taking: Reported on 08/09/2019) 45 capsule 3  . fluticasone (FLONASE) 50 MCG/ACT nasal spray Place 1 spray into both nostrils daily. (Patient not taking: Reported on 08/09/2019) 16 g 2  . tamsulosin (FLOMAX) 0.4 MG CAPS capsule Take 0.4 mg by mouth daily after supper. For kidney stone     No facility-administered medications prior to visit.     Review of Systems:   Constitutional:   No  weight loss, night sweats,  Fevers, chills, fatigue, or  lassitude.  HEENT:   No headaches,  Difficulty swallowing,  Tooth/dental problems, or  Sore throat,  No sneezing, itching, ear ache, nasal congestion, post nasal drip,   CV:  No chest pain,  Orthopnea, PND, swelling in lower extremities, anasarca, dizziness, palpitations, syncope.   GI  No heartburn, indigestion, abdominal pain, nausea, vomiting, diarrhea, change in bowel habits, loss of appetite, bloody stools.   Resp:    No chest wall deformity  Skin: no rash or lesions.  GU: no dysuria, change in color of urine, no urgency or frequency.  No flank pain, no hematuria   MS:  No joint pain or swelling.  No decreased range of motion.  No back pain.     Physical Exam  BP (!) 132/92 (BP Location: Left Arm, Cuff Size: Normal)   Pulse 77   Temp 97.9 F (36.6 C) (Temporal)   Ht 6\' 1"  (1.854 m)   Wt 191 lb 12.8 oz (87 kg)   SpO2 100% Comment: RA  BMI 25.30 kg/m   GEN: A/Ox3; pleasant , NAD, well nourished    HEENT:  [place]/AT,   NOSE-clear, THROAT-clear, no lesions, no postnasal drip or exudate noted.   NECK:  Supple w/ fair ROM; no JVD; normal carotid impulses w/o bruits; no thyromegaly or nodules palpated; no lymphadenopathy.    RESP  Clear  P & A; w/o, wheezes/ rales/ or rhonchi. no accessory muscle use, no dullness to percussion  CARD:  RRR, no m/r/g, no peripheral edema, pulses intact, no cyanosis or clubbing.  GI:   Soft & nt; nml bowel sounds; no organomegaly or masses detected.   Musco: Warm bil, no deformities or joint swelling noted.   Neuro: alert, no focal deficits noted.    Skin: Warm, no lesions or rashes    Lab Results:  CBC  BMET  BNP No results found for: BNP  ProBNP No results found for: PROBNP  Imaging: No results found.    No flowsheet data found.  No results found for: NITRICOXIDE      Assessment & Plan:   Bronchiectasis ([place]) Stable on current regimen.  Continue with pulmonary hygiene regimen.  Control triggers  Plan  Patient Instructions  Delsym 2 tsp Twice daily  For cough As needed   Tessalon Three times a day  For cough As needed   Saline nasal rinses Twice daily   Flonase 1 spray in each nostril nightly As needed  Drainage .  Claritin 10mg  in am As needed  Drainage .  GERD diet.  Follow up with Dr. Halford Chessman  Or Dalbert Stillings NP in 6 months and As needed.       Upper airway cough syndrome Control for triggers.  Plan  Patient Instructions  Delsym 2 tsp Twice daily  For cough As needed   Tessalon Three times a day  For cough As needed   Saline nasal rinses Twice daily   Flonase 1 spray in each nostril nightly As needed  Drainage .  Claritin 10mg  in am As needed  Drainage .   GERD diet.  Follow up with Dr. Halford Chessman  Or Tangee Marszalek NP in 6 months and As needed.         Total patient care time 24 minutes  Rexene Edison, NP 08/09/2019

## 2019-08-13 NOTE — Progress Notes (Signed)
Reviewed and agree with assessment/plan.   Othon Guardia, MD [facility] 07/21/2016, 12:24 PM Pager:  [phone]  

## 2019-12-13 ENCOUNTER — Ambulatory Visit
Admission: RE | Admit: 2019-12-13 | Discharge: 2019-12-13 | Disposition: A | Discharge status: Home / Self Care | Payer: BC Managed Care – PPO | Source: Ambulatory Visit | Attending: Family Medicine | Admitting: Family Medicine

## 2019-12-13 ENCOUNTER — Other Ambulatory Visit: Payer: Self-pay | Admitting: Family Medicine

## 2019-12-13 DIAGNOSIS — J189 Pneumonia, unspecified organism: Secondary | ICD-10-CM

## 2019-12-14 ENCOUNTER — Other Ambulatory Visit: Payer: Self-pay | Admitting: Physician Assistant

## 2019-12-14 DIAGNOSIS — I77810 Thoracic aortic ectasia: Secondary | ICD-10-CM

## 2019-12-26 ENCOUNTER — Ambulatory Visit
Admission: RE | Admit: 2019-12-26 | Discharge: 2019-12-26 | Disposition: A | Discharge status: Home / Self Care | Payer: BC Managed Care – PPO | Source: Ambulatory Visit | Attending: Physician Assistant | Admitting: Physician Assistant

## 2019-12-26 ENCOUNTER — Other Ambulatory Visit: Payer: Self-pay

## 2019-12-26 DIAGNOSIS — I77810 Thoracic aortic ectasia: Secondary | ICD-10-CM

## 2019-12-26 MED ORDER — IOPAMIDOL (ISOVUE-370) INJECTION 76%
75.0000 mL | Freq: Once | INTRAVENOUS | Status: AC | PRN
  Administered 2019-12-26: 75 mL via INTRAVENOUS

## 2020-02-06 ENCOUNTER — Ambulatory Visit: Payer: BC Managed Care – PPO | Admitting: Adult Health

## 2020-02-08 ENCOUNTER — Other Ambulatory Visit: Payer: Self-pay

## 2020-02-08 ENCOUNTER — Ambulatory Visit (INDEPENDENT_AMBULATORY_CARE_PROVIDER_SITE_OTHER): Payer: BC Managed Care – PPO | Admitting: Adult Health

## 2020-02-08 ENCOUNTER — Encounter: Payer: Self-pay | Admitting: Adult Health

## 2020-02-08 VITALS — BP 120/70 | HR 82 | Ht 73.0 in | Wt 191.0 lb

## 2020-02-08 DIAGNOSIS — J479 Bronchiectasis, uncomplicated: Secondary | ICD-10-CM

## 2020-02-08 NOTE — Patient Instructions (Addendum)
Mucinex Twice daily  As needed  Cough/congestion  Begin Flutter valve Three times a day  .  Saline nasal rinses Twice daily   Flonase 1 spray in each nostril nightly As needed  Drainage .  Claritin 10mg  in am As needed  Drainage .  GERD diet.  Activity as tolerated.  Follow up with Dr. Halford Chessman  Or Albert Devaul NP in 4 months  and As needed.

## 2020-02-08 NOTE — Assessment & Plan Note (Addendum)
Recent exacerbation with superimposed probable pneumonia.  CT chest showed bronchiectasis along with significant mucous plugging.  Patient sputum culture was positive for Serratia.  Patient is clinically improved after broad-spectrum antibiotics. Would add flutter valve to improve on his pulmonary hygiene regimen. Previous pulmonary function testing in 2019 showed normal lung function.  Care everywhere reviewed PFTs 2020 were stable. Sputum AFB cultures 2020 and recent repeated AFB sputum's were negative  Plan  Patient Instructions  Mucinex Twice daily  As needed  Cough/congestion  Begin Flutter valve Three times a day  .  Saline nasal rinses Twice daily   Flonase 1 spray in each nostril nightly As needed  Drainage .  Claritin 10mg  in am As needed  Drainage .  GERD diet.  Activity as tolerated.  Follow up with Dr. Halford Chessman  Or Manaia Samad NP in 4 months  and As needed.

## 2020-02-08 NOTE — Progress Notes (Signed)
@Patient  ID: Jerry Grant, male    DOB: [birth year], 64 y.o.   MRN: [id number]  Chief Complaint  Patient presents with  . Follow-up    Bronchiectasis     Referring provider: Heywood Bene, *  HPI: 64 year old male former smoker seen for pulmonary consult February fifth 2020 for chronic cough for 2 years.  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  Influenza pneumonia in 2018   TEST/EVENTS :  PFT 01/05/18 >>FEV1 3.37 (94%), FEV1% 74, TLC 7.50 (104%), DLCO 88%  CT chest 09/21/16 >> micronodularity and GGO in Lt and Rt lungs likely from viral pneumonia  Echo 01/24/17 >> EF 60 to 65%  High-resolution CT chest May 2020 showed minimum bronchiectasis with mild patchy tree-in-bud opacity in the lower lungs.  Ectatic 4.3 ascending thoracic aorta.  Sputum AFB June 2020 -NEG   02/08/2020 Follow up : Bronchiectasis Patient presents for a 63-month follow-up.  Patient says overall he is currently doing well with his breathing with no flare of cough or wheezing.  Does have occasional minimally productive cough.  Patient says about 6 -8 weeks ago he started having increased cough and congestion.  Subsequent CT chest showed extensive bronchial wall thickening and plugging of the left lower lobe bronchi with a bandlike consolidation and clustered nodularity in the left base.  Bronchial plugging and clustered nodularity in the peripheral right upper lobe.  Consistent with a multifocal infection including atypical infection such as atypical Mycobacterium.  Patient was seen by his primary care [facility] at [facility].  Sputum culture did return back positive for Serratia.  He was treated with aggressive broad-spectrum antibiotics for 14 days.  Per telephone message through care everywhere sputum AFBs were negative.  Since finishing  antibiotics patient says he is feeling much better cough and congestion are back to baseline.  He denies any hemoptysis chest pain orthopnea PND or leg swelling. 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  He views this is a very positive process.  No Known Allergies  Immunization History  Administered Date(s) Administered  . DTaP / IPV 07/05/2016, 08/30/2016, 11/22/2016  . Influenza Split 05/26/2018  . Influenza,inj,Quad PF,6+ Mos 06/28/2015  . Influenza,inj,Quad PF,6-35 Mos 04/26/2019  . PFIZER SARS-COV-2 Vaccination 10/04/2019, 10/25/2019  . Pneumococcal Polysaccharide-23 10/03/2015    Past Medical History:  Diagnosis Date  . Chest pain   . Diastolic dysfunction   . Diverticulosis   . Dysrhythmia 07-16-11   '05/'06 issues with rapid heart beat, stress negative.  Marland Kitchen FHx: heart disease    of early heart disease  . 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 Shortness of breath 07-16-11   2 weeks ago-testing found related to gastric tumor with bleeding    Tobacco History: Social History   Tobacco Use  Smoking Status Former Smoker  . Packs/day: 1.00  . Years: 10.00  . Pack years: 10.00  . Types: Cigarettes  . Quit date: 07/15/1984  . Years since quitting: 35.5  Smokeless Tobacco Never Used   Counseling given: Not Answered   Outpatient Medications Prior to Visit  Medication Sig Dispense Refill  . benzonatate (TESSALON) 200 MG capsule Take 1  capsule (200 mg total) by mouth 3 (three) times daily as needed for cough. (Patient not taking: Reported on 08/09/2019) 45 capsule 3  . fluticasone (FLONASE) 50 MCG/ACT nasal spray Place 1 spray into both nostrils daily. (Patient not taking: Reported on 08/09/2019) 16 g 2  . sodium chloride (OCEAN) 0.65 % SOLN nasal spray Place 1 spray into both nostrils as needed for  congestion.     No facility-administered medications prior to visit.     Review of Systems:   Constitutional:   No  weight loss, night sweats,  Fevers, chills,  +fatigue, or  lassitude.  HEENT:   No headaches,  Difficulty swallowing,  Tooth/dental problems, or  Sore throat,                No sneezing, itching, ear ache, nasal congestion, post nasal drip,   CV:  No chest pain,  Orthopnea, PND, swelling in lower extremities, anasarca, dizziness, palpitations, syncope.   GI  No heartburn, indigestion, abdominal pain, nausea, vomiting, diarrhea, change in bowel habits, loss of appetite, bloody stools.   Resp:  .  No chest wall deformity  Skin: no rash or lesions.  GU: no dysuria, change in color of urine, no urgency or frequency.  No flank pain, no hematuria   MS:  No joint pain or swelling.  No decreased range of motion.  No back pain.    Physical Exam  BP 120/70 (BP Location: Left Arm, Cuff Size: Normal)   Pulse 82   Ht 6\' 1"  (1.854 m)   Wt 191 lb (86.6 kg)   SpO2 97%   BMI 25.20 kg/m   GEN: A/Ox3; pleasant , NAD, well nourished    HEENT:  [place]/AT,  EACs-clear, TMs-wnl, NOSE-clear, THROAT-clear, no lesions, no postnasal drip or exudate noted.   NECK:  Supple w/ fair ROM; no JVD; normal carotid impulses w/o bruits; no thyromegaly or nodules palpated; no lymphadenopathy.    RESP  Clear  P & A; w/o, wheezes/ rales/ or rhonchi. no accessory muscle use, no dullness to percussion  CARD:  RRR, no m/r/g, no peripheral edema, pulses intact, no cyanosis or clubbing.  GI:   Soft & nt; nml bowel sounds; no organomegaly or masses detected.   Musco: Warm bil, no deformities or joint swelling noted.   Neuro: alert, no focal deficits noted.    Skin: Warm, no lesions or rashes    Lab Results:   BMET  BNP No results found for: BNP  ProBNP No results found for: PROBNP  Imaging: No results found.    No flowsheet data found.  No results found for:  NITRICOXIDE      Assessment & Plan:   Bronchiectasis ([place]) Recent exacerbation with superimposed probable pneumonia.  CT chest showed bronchiectasis along with significant mucous plugging.  Patient sputum culture was positive for Serratia.  Patient is clinically improved after broad-spectrum antibiotics. Would add flutter valve to improve on his pulmonary hygiene regimen. Previous pulmonary function testing in 2019 showed normal lung function.  Care everywhere reviewed PFTs 2020 were stable. Sputum AFB cultures 2020 and recent repeated AFB sputum's were negative  Plan  Patient Instructions  Mucinex Twice daily  As needed  Cough/congestion  Begin Flutter valve Three times a day  .  Saline nasal rinses Twice daily   Flonase 1 spray in each nostril nightly As needed  Drainage .  Claritin 10mg  in am As needed  Drainage .  GERD diet.  Activity as tolerated.  Follow up  with Dr. Halford Chessman  Or Afrika Brick NP in 4 months  and As needed.          Rexene Edison, NP 02/08/2020

## 2020-02-09 NOTE — Progress Notes (Signed)
Reviewed and agree with assessment/plan.   [name], MD [facility] 02/09/2020, 8:04 AM Pager:  [phone]

## 2020-02-15 ENCOUNTER — Other Ambulatory Visit: Payer: Self-pay | Admitting: Physician Assistant

## 2020-02-15 DIAGNOSIS — M8580 Other specified disorders of bone density and structure, unspecified site: Secondary | ICD-10-CM

## 2020-05-15 ENCOUNTER — Ambulatory Visit
Admission: RE | Admit: 2020-05-15 | Discharge: 2020-05-15 | Disposition: A | Discharge status: Home / Self Care | Payer: Medicare Other | Source: Ambulatory Visit | Attending: Physician Assistant | Admitting: Physician Assistant

## 2020-05-15 ENCOUNTER — Other Ambulatory Visit: Payer: Self-pay

## 2020-05-15 DIAGNOSIS — M8580 Other specified disorders of bone density and structure, unspecified site: Secondary | ICD-10-CM

## 2020-06-12 ENCOUNTER — Ambulatory Visit: Payer: BC Managed Care – PPO | Admitting: Adult Health

## 2020-06-23 ENCOUNTER — Ambulatory Visit: Payer: Medicare Other | Admitting: Adult Health

## 2020-07-14 ENCOUNTER — Encounter: Payer: Self-pay | Admitting: Adult Health

## 2020-07-14 ENCOUNTER — Other Ambulatory Visit: Payer: Self-pay

## 2020-07-14 ENCOUNTER — Ambulatory Visit: Payer: Medicare Other | Admitting: Adult Health

## 2020-07-14 DIAGNOSIS — J309 Allergic rhinitis, unspecified: Secondary | ICD-10-CM | POA: Diagnosis not present

## 2020-07-14 DIAGNOSIS — J479 Bronchiectasis, uncomplicated: Secondary | ICD-10-CM

## 2020-07-14 DIAGNOSIS — R058 Other specified cough: Secondary | ICD-10-CM | POA: Diagnosis not present

## 2020-07-14 NOTE — Progress Notes (Signed)
@Patient  ID: Jerry Grant, male    DOB: 06-22-1956, 64 y.o.   MRN: [id number]  Chief Complaint  Patient presents with   Bronchiecatsis     Referring provider: Heywood Bene, *  HPI: 64 year old male former smoker seen for pulmonary consult February 2020 for chronic cough for 2 years.  Found to have bronchiectasis.  Medical history significant for B cell acute lymphoblastic leukemia diagnosed in 2016 status post chemo and bone marrow transplant in 2017. (Remains Remission)  Had PICC line associated bacteremia in 2017.  Influenza pneumonia in 2018.  TEST/EVENTS :  PFT 01/05/18 >>FEV1 3.37 (94%), FEV1% 74, TLC 7.50 (104%), DLCO 88%  CT chest 09/21/16 >> micronodularity and GGO in Lt and Rt lungs likely from viral pneumonia  Echo 01/24/17 >> EF 60 to 65%  High-resolution CT chest May 2020 showed minimum bronchiectasis with mild patchy tree-in-bud opacity in the lower lungs. Ectatic 4.3 ascending thoracic aorta.  Sputum AFB June 2020 -NEG   07/14/2020 Follow up : Bronchiectasis  Patient presents for a 65-month follow-up.  Patient has underlying bronchiectasis.  Says he has been doing very well with no flare of his cough or wheezing.  He is using flutter valve which he believes has helped him quite a bit. Patient was seen last visit at that time and had a pneumonia.  Sputum cultures were positive for Serratia.  He was treated with broad-spectrum antibiotics for 14 days.  Patient says he has had no recurrence of symptoms. Feels very well.  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  Has daily cough, describes  Covid vaccine is up-to-date with Covid booster as well.  Influenza vaccine is up-to-date.    No Known Allergies  Immunization History  Administered Date(s) Administered   DTaP / IPV 07/05/2016, 08/30/2016, 11/22/2016   Fluad Quad(high Dose 65+) 03/26/2020   Influenza Split 05/26/2018   Influenza,inj,Quad PF,6+ Mos  06/28/2015   Influenza,inj,Quad PF,6-35 Mos 04/26/2019   PFIZER SARS-COV-2 Vaccination 10/04/2019, 10/25/2019, 04/25/2020   Pneumococcal Polysaccharide-23 10/03/2015    Past Medical History:  Diagnosis Date   Chest pain    Diastolic dysfunction    Diverticulosis    Dysrhythmia 07-16-11   '05/'06 issues with rapid heart beat, stress negative.   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   No facility-administered medications prior to visit.     Review of Systems:   Constitutional:   No  weight loss, night sweats,  Fevers, chills, fatigue, or  lassitude.  HEENT:   No headaches,  Difficulty swallowing,  Tooth/dental problems, or  Sore throat,                No sneezing, itching, ear ache, + nasal congestion, post nasal drip,   CV:  No chest pain,  Orthopnea, PND, swelling in lower extremities, anasarca, dizziness, palpitations, syncope.   GI  No heartburn, indigestion, abdominal pain, nausea, vomiting,  diarrhea, change in bowel habits, loss of appetite, bloody stools.   Resp:  .  No chest wall deformity  Skin: no rash or lesions.  GU: no dysuria, change in color of urine, no urgency or frequency.  No flank pain, no hematuria   MS:  No joint pain or swelling.  No decreased range of motion.  No back pain.    Physical Exam  BP 130/90 (BP Location: Left Arm, Patient Position: Sitting, Cuff Size: Normal)    Pulse 87    Temp 98.1 F (36.7 C) (Temporal)    Ht 6\' 2"  (1.88 m)    Wt  197 lb (89.4 kg)    SpO2 98%    BMI 25.29 kg/m   GEN: A/Ox3; pleasant , NAD, well nourished    HEENT:  [place]/AT,    NOSE-clear, THROAT-clear, no lesions, no postnasal drip or exudate noted.   NECK:  Supple w/ fair ROM; no JVD; normal carotid impulses w/o bruits; no thyromegaly or nodules palpated; no lymphadenopathy.    RESP  Clear  P & A; w/o, wheezes/ rales/ or rhonchi. no accessory muscle use, no dullness to percussion  CARD:  RRR, no m/r/g, no peripheral edema, pulses intact, no cyanosis or clubbing.  GI:   Soft & nt; nml bowel sounds; no organomegaly or masses detected.   Musco: Warm bil, no deformities or joint swelling noted.   Neuro: alert, no focal deficits noted.    Skin: Warm, no lesions or rashes    Lab Results:  CBC  BNP No results found for: BNP  ProBNP No results found for: PROBNP  Imaging: No results found.    No flowsheet data found.  No results found for: NITRICOXIDE      Assessment & Plan:   Bronchiectasis ([place]) Currently doing very well.  Flutter valve has improved clinical symptoms significantly.  Continue on current regimen.  Plan  Patient Instructions  Mucinex Twice daily  As needed  Cough/congestion  Flutter valve Three times a day  .  Saline nasal rinses Twice daily   Flonase 1 spray in each nostril nightly As needed  Drainage .  Claritin 10mg  in am As needed  Drainage .  GERD diet.  Activity as tolerated.  Follow up with Dr. Halford Chessman  Or Tikisha Molinaro NP in 6 months  and As needed.       Allergic rhinitis Doing well without flare.  May use saline nasal spray and Claritin as needed.  Control for triggers  Upper airway cough syndrome Continue on cough control regimen with Mucinex and flutter valve.  Control for triggers including postnasal drip and GERD.  Plan  Patient Instructions  Mucinex Twice daily  As needed  Cough/congestion  Flutter valve Three times a day  .  Saline nasal rinses Twice daily   Flonase 1 spray in each nostril  nightly As needed  Drainage .  Claritin 10mg  in am As needed  Drainage .  GERD diet.  Activity as tolerated.  Follow up with Dr. Halford Chessman  Or Adelita Hone NP in 6 months  and As needed.          Rexene Edison, NP 07/14/2020

## 2020-07-14 NOTE — Progress Notes (Signed)
Reviewed and agree with assessment/plan.   Shannie Kontos, MD [place] [facility] 07/14/2020, 12:57 PM Pager:  [phone]  

## 2020-07-14 NOTE — Assessment & Plan Note (Signed)
Continue on cough control regimen with Mucinex and flutter valve.  Control for triggers including postnasal drip and GERD.  Plan  Patient Instructions  Mucinex Twice daily  As needed  Cough/congestion  Flutter valve Three times a day  .  Saline nasal rinses Twice daily   Flonase 1 spray in each nostril nightly As needed  Drainage .  Claritin 10mg  in am As needed  Drainage .  GERD diet.  Activity as tolerated.  Follow up with Dr. Halford Chessman  Or Rheana Casebolt NP in 6 months  and As needed.

## 2020-07-14 NOTE — Assessment & Plan Note (Signed)
Currently doing very well.  Flutter valve has improved clinical symptoms significantly.  Continue on current regimen.  Plan  Patient Instructions  Mucinex Twice daily  As needed  Cough/congestion  Flutter valve Three times a day  .  Saline nasal rinses Twice daily   Flonase 1 spray in each nostril nightly As needed  Drainage .  Claritin 10mg  in am As needed  Drainage .  GERD diet.  Activity as tolerated.  Follow up with Dr. Halford Chessman  Or Natarsha Hurwitz NP in 6 months  and As needed.

## 2020-07-14 NOTE — Assessment & Plan Note (Signed)
Doing well without flare.  May use saline nasal spray and Claritin as needed.  Control for triggers

## 2020-07-14 NOTE — Patient Instructions (Signed)
Mucinex Twice daily  As needed  Cough/congestion  Flutter valve Three times a day  .  Saline nasal rinses Twice daily   Flonase 1 spray in each nostril nightly As needed  Drainage .  Claritin 10mg  in am As needed  Drainage .  GERD diet.  Activity as tolerated.  Follow up with Dr. Halford Chessman  Or Genieve Ramaswamy NP in 6 months  and As needed.

## 2021-01-15 ENCOUNTER — Encounter: Payer: Self-pay | Admitting: Adult Health

## 2021-01-15 ENCOUNTER — Other Ambulatory Visit: Payer: Self-pay

## 2021-01-15 ENCOUNTER — Ambulatory Visit: Payer: Medicare Other | Admitting: Adult Health

## 2021-01-15 DIAGNOSIS — J479 Bronchiectasis, uncomplicated: Secondary | ICD-10-CM | POA: Diagnosis not present

## 2021-01-15 DIAGNOSIS — R058 Other specified cough: Secondary | ICD-10-CM

## 2021-01-15 DIAGNOSIS — J309 Allergic rhinitis, unspecified: Secondary | ICD-10-CM

## 2021-01-15 NOTE — Assessment & Plan Note (Signed)
Doing well w/ no flare in last 6 months  Continue on preventive regimen  Flutter valve   Plan  Patient Instructions  Mucinex Twice daily  As needed  Cough/congestion  Flutter valve two- Three times a day  .  Saline nasal rinses Twice daily   Flonase 1 spray in each nostril nightly As needed  Drainage .  Claritin 10mg  in am As needed  Drainage .  GERD diet.  Activity as tolerated.  Follow up with Dr. Halford Chessman  Or Calisa Luckenbaugh NP in 6 months  and As needed.   Second Covid vaccine booster as discussed.

## 2021-01-15 NOTE — Progress Notes (Signed)
@Patient  ID: Jerry Grant, male    DOB: [birth year], 65 y.o.   MRN: [id number]  Chief Complaint  Patient presents with   Follow-up    Referring provider: Heywood Bene, *  HPI: 65 year old male former smoker seen for pulmonary consult February 2020 for chronic cough for 2 years.  Found to have bronchiectasis.  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  PICC line associated bacteremia in 2017, influenza pneumonia in 2018, Serratia pneumonia 2020  TEST/EVENTS :  PFT 01/05/18 >>FEV1 3.37 (94%), FEV1% 74, TLC 7.50 (104%), DLCO 88%   CT chest 09/21/16 >> micronodularity and GGO in Lt and Rt lungs likely from viral  pneumonia   Echo 01/24/17 >> EF 60 to 65%   High-resolution CT chest May 2020 showed minimum bronchiectasis with mild patchy tree-in-bud opacity in the lower lungs.  Ectatic 4.3 ascending thoracic aorta.   Sputum AFB June 2020 -NEG   01/15/2021 Follow up : Bronchiectasis Patient presents for a 48-month follow-up.  Patient has underlying bronchiectasis.  Patient says overall he is doing well.  He denies any flare of cough or wheezing.  Has not been on any antibiotics.  He says he uses his flutter valve most days.  Cough is much better. Has done very well over last 6 months.  Appetite is good. No weight loss.   Recent check  up with Oncology with good report, remains in remission from Leukemia .   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   COVID-vaccine is up-to-date with booster x1. We discussed second booster .  Had Covid 19 infection in May , had a mild infection. No known sequelae .   Has mild allergies. Did well this spring.   No Known Allergies  Immunization History  Administered Date(s) Administered   DTaP / IPV 07/05/2016, 08/30/2016, 11/22/2016   Fluad Quad(high Dose 65+) 03/26/2020    Influenza Split 05/26/2018   Influenza,inj,Quad PF,6+ Mos 06/28/2015   Influenza,inj,Quad PF,6-35 Mos 04/26/2019   PFIZER(Purple Top)SARS-COV-2 Vaccination 10/04/2019, 10/25/2019, 04/25/2020   Pneumococcal Polysaccharide-23 10/03/2015    Past Medical History:  Diagnosis Date   Chest pain    Diastolic dysfunction    Diverticulosis    Dysrhythmia 07-16-11   '05/'06 issues with rapid heart beat, stress negative.   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 07-08-11, now surgery planned   Shortness of breath 07-16-11   2 weeks ago-testing found related to gastric tumor with bleeding    Tobacco History: Social History   Tobacco Use  Smoking Status Former   Packs/day: 1.00   Years: 10.00   Pack years: 10.00   Types: Cigarettes   Quit date: 07/15/1984   Years since quitting: 36.5  Smokeless Tobacco Never   Counseling given: Not Answered   Outpatient Medications Prior to Visit  Medication Sig Dispense Refill   Calcium Carb-Cholecalciferol (CALCIUM/VITAMIN D PO) Take by mouth.     Multiple Vitamin (MULTI-VITAMIN PO) Take by mouth.     No facility-administered medications prior to visit.     Review of Systems:   Constitutional:   No  weight loss, night sweats,  Fevers, chills, fatigue, or  lassitude.  HEENT:   No headaches,  Difficulty swallowing,  Tooth/dental problems, or  Sore throat,  No sneezing, itching, ear ache, nasal congestion, post nasal drip,   CV:  No chest pain,  Orthopnea, PND, swelling in lower extremities, anasarca, dizziness, palpitations, syncope.   GI  No heartburn, indigestion, abdominal pain, nausea, vomiting, diarrhea, change in bowel habits, loss of appetite, bloody stools.   Resp: No shortness of breath with exertion or at rest.  No excess mucus, no productive cough,  No non-productive cough,  No coughing up of blood.  No change in color of mucus.  No wheezing.  No chest wall deformity  Skin: no  rash or lesions.  GU: no dysuria, change in color of urine, no urgency or frequency.  No flank pain, no hematuria   MS:  No joint pain or swelling.  No decreased range of motion.  No back pain.    Physical Exam  BP (!) 142/90   Pulse 78   Ht 6\' 2"  (1.88 m)   Wt 195 lb 6.4 oz (88.6 kg)   SpO2 96%   BMI 25.09 kg/m   GEN: A/Ox3; pleasant , NAD, well nourished    HEENT:  [place]/AT,    NOSE-clear, THROAT-clear, no lesions, no postnasal drip or exudate noted.   NECK:  Supple w/ fair ROM; no JVD; normal carotid impulses w/o bruits; no thyromegaly or nodules palpated; no lymphadenopathy.    RESP  Clear  P & A; w/o, wheezes/ rales/ or rhonchi. no accessory muscle use, no dullness to percussion  CARD:  RRR, no m/r/g, no peripheral edema, pulses intact, no cyanosis or clubbing.  GI:   Soft & nt; nml bowel sounds; no organomegaly or masses detected.   Musco: Warm bil, no deformities or joint swelling noted.   Neuro: alert, no focal deficits noted.    Skin: Warm, no lesions or rashes    Lab Results:     BNP No results found for: BNP  ProBNP No results found for: PROBNP  Imaging: No results found.    No flowsheet data found.  No results found for: NITRICOXIDE      Assessment & Plan:   Bronchiectasis ([place]) Doing well w/ no flare in last 6 months  Continue on preventive regimen  Flutter valve   Plan  Patient Instructions  Mucinex Twice daily  As needed  Cough/congestion  Flutter valve two- Three times a day  .  Saline nasal rinses Twice daily   Flonase 1 spray in each nostril nightly As needed  Drainage .  Claritin 10mg  in am As needed  Drainage .  GERD diet.  Activity as tolerated.  Follow up with Dr. Halford Chessman  Or Harleigh Civello NP in 6 months  and As needed.   Second Covid vaccine booster as discussed.      Upper airway cough syndrome Much improved on regimen   Plan Patient Instructions  Mucinex Twice daily  As needed  Cough/congestion  Flutter valve two-  Three times a day  .  Saline nasal rinses Twice daily   Flonase 1 spray in each nostril nightly As needed  Drainage .  Claritin 10mg  in am As needed  Drainage .  GERD diet.  Activity as tolerated.  Follow up with Dr. Halford Chessman  Or Dezire Turk NP in 6 months  and As needed.   Second Covid vaccine booster as discussed.      Allergic rhinitis Under good control   Plan  Patient Instructions  Mucinex Twice daily  As needed  Cough/congestion  Flutter valve two- Three times a day  .  Saline  nasal rinses Twice daily   Flonase 1 spray in each nostril nightly As needed  Drainage .  Claritin 10mg  in am As needed  Drainage .  GERD diet.  Activity as tolerated.  Follow up with Dr. Halford Chessman  Or Waleed Dettman NP in 6 months  and As needed.   Second Covid vaccine booster as discussed.        Rexene Edison, NP 01/15/2021

## 2021-01-15 NOTE — Assessment & Plan Note (Signed)
Much improved on regimen   Plan Patient Instructions  Mucinex Twice daily  As needed  Cough/congestion  Flutter valve two- Three times a day  .  Saline nasal rinses Twice daily   Flonase 1 spray in each nostril nightly As needed  Drainage .  Claritin 10mg  in am As needed  Drainage .  GERD diet.  Activity as tolerated.  Follow up with Dr. Halford Chessman  Or Jaciel Diem NP in 6 months  and As needed.   Second Covid vaccine booster as discussed.

## 2021-01-15 NOTE — Assessment & Plan Note (Signed)
Under good control   Plan  Patient Instructions  Mucinex Twice daily  As needed  Cough/congestion  Flutter valve two- Three times a day  .  Saline nasal rinses Twice daily   Flonase 1 spray in each nostril nightly As needed  Drainage .  Claritin 10mg  in am As needed  Drainage .  GERD diet.  Activity as tolerated.  Follow up with Dr. Halford Chessman  Or Sloan Takagi NP in 6 months  and As needed.   Second Covid vaccine booster as discussed.

## 2021-01-15 NOTE — Patient Instructions (Signed)
Mucinex Twice daily  As needed  Cough/congestion  Flutter valve two- Three times a day  .  Saline nasal rinses Twice daily   Flonase 1 spray in each nostril nightly As needed  Drainage .  Claritin 10mg  in am As needed  Drainage .  GERD diet.  Activity as tolerated.  Follow up with Dr. Halford Chessman  Or Aaron Boeh NP in 6 months  and As needed.   Second Covid vaccine booster as discussed.

## 2021-01-19 NOTE — Progress Notes (Signed)
Reviewed and agree with assessment/plan.   [name], MD [facility] 01/19/2021, 3:23 PM Pager:  [phone]

## 2021-02-21 ENCOUNTER — Encounter (HOSPITAL_BASED_OUTPATIENT_CLINIC_OR_DEPARTMENT_OTHER): Payer: Self-pay | Admitting: Emergency Medicine

## 2021-02-21 ENCOUNTER — Other Ambulatory Visit: Payer: Self-pay

## 2021-02-21 ENCOUNTER — Emergency Department (HOSPITAL_BASED_OUTPATIENT_CLINIC_OR_DEPARTMENT_OTHER)
Admission: EM | Admit: 2021-02-21 | Discharge: 2021-02-21 | Disposition: A | Discharge status: Home / Self Care | Payer: Medicare Other | Attending: Emergency Medicine | Admitting: Emergency Medicine

## 2021-02-21 DIAGNOSIS — I10 Essential (primary) hypertension: Secondary | ICD-10-CM | POA: Insufficient documentation

## 2021-02-21 DIAGNOSIS — Z87891 Personal history of nicotine dependence: Secondary | ICD-10-CM | POA: Insufficient documentation

## 2021-02-21 MED ORDER — LOSARTAN POTASSIUM 25 MG PO TABS: 25.0000 mg | ORAL_TABLET | Freq: Every day | ORAL | 0 refills | Status: DC

## 2021-02-21 NOTE — ED Provider Notes (Signed)
[facility] EMERGENCY DEPT Provider Note   CSN: [id number] Arrival date & time: 02/21/21  0756     History Chief Complaint  Patient presents with   Hypertension    Jerry Grant is a 65 y.o. male.  HPI 65 year old male presents today complaining of hypertension.  He has noted some elevated blood pressures.  He was seen by his primary care provider last week.  He wanted to wait and see if he could do home control instead of starting losartan.  He has been taking his blood pressure at home and is actually noticed it increasing.  Over the past 48 hours the systolic blood pressures have been running in the 150s.  He denies any headache, head injury, vision change, chest pain, dyspnea, abdominal pain.  He recently was seen for labs at his oncology follow-up.  EKG was performed on his last office visit that showed a normal sinus rhythm with no acute abnormalities.  Labs obtained in June showed a normal creatinine.    Past Medical History:  Diagnosis Date   Chest pain    Diastolic dysfunction    Diverticulosis    Dysrhythmia 07-16-11   '05/'06 issues with rapid heart beat, stress negative.   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 inguinal hernia repair   LAMINOTOMY  07-16-11   lumbar 4-5 disc surgery   VASECTOMY  07-16-11   '89        Family History  Problem Relation Age of Onset   Hypertension Mother    Heart disease Father    Hypertension Father    Coronary artery disease Father        with CABG   Stroke Father        TIA's   Aortic aneurysm Father    Heart disease Maternal Grandmother    Cancer Maternal Grandfather        testicular   Heart disease Maternal Grandfather    Heart disease Paternal Grandmother    Heart disease Paternal Grandfather    Anesthesia problems Neg Hx    Malignant hyperthermia Neg Hx     Social History   Tobacco Use   Smoking status: Former    Packs/day: 1.00    Years: 10.00    Pack years: 10.00    Types: Cigarettes    Quit date: 07/15/1984    Years since quitting: 36.6   Smokeless tobacco: Never  Substance Use Topics   Alcohol use: No    Alcohol/week: 0.0 standard drinks   Drug use: No    Home Medications Prior to Admission medications   Medication Sig Start Date End Date Taking?  Authorizing Provider  Calcium Carb-Cholecalciferol (CALCIUM/VITAMIN D PO) Take by mouth.    [provider]  Multiple Vitamin (MULTI-VITAMIN PO) Take by mouth.    [provider]    Allergies    Patient has no known allergies.  Review of Systems   Review of Systems  All other systems reviewed and are negative.  Physical Exam Updated Vital Signs There were no vitals taken for this visit.  Physical Exam Vitals and nursing note reviewed.  Constitutional:      Appearance: Normal appearance.  HENT:     Head: Normocephalic and atraumatic.     Right Ear: External ear normal.     Left Ear: External ear normal.     Nose: Nose normal.     Mouth/Throat:     Pharynx: Oropharynx is clear.  Eyes:     Pupils: Pupils are equal, round, and reactive to light.  Cardiovascular:     Rate and Rhythm: Normal rate and regular rhythm.      Pulses: Normal pulses.     Heart sounds: Normal heart sounds.  Pulmonary:     Effort: Pulmonary effort is normal.  Abdominal:     General: Abdomen is flat. Bowel sounds are normal.  Musculoskeletal:        General: Normal range of motion.     Cervical back: Normal range of motion.  Skin:    General: Skin is warm.     Capillary Refill: Capillary refill takes less than 2 seconds.  Neurological:     General: No focal deficit present.     Mental Status: He is alert.     Cranial Nerves: No cranial nerve deficit.     Motor: No weakness.     Coordination: Coordination normal.     Deep Tendon Reflexes: Reflexes normal.  Psychiatric:        Mood and Affect: Mood normal.    ED Results / Procedures / Treatments   Labs (all labs ordered are listed, but only abnormal results are displayed) Labs Reviewed - No data to display  EKG None  Radiology No results found.  Procedures Procedures   Medications Ordered in ED Medications - No data to display  ED Course  I have reviewed the triage vital signs and the nursing notes.  Pertinent labs & imaging results that were available during my care of the patient were reviewed by me and considered in my medical decision making (see chart for details).    MDM Rules/Calculators/A&P                           Patient presents today with a central hypertension otherwise asymptomatic.  Labs and EKG from recent visits reviewed.  Reviewed his primary care provider's note and they had plan to start losartan.  Patient advised regarding return precautions and need for follow-up and voices understanding. Final Clinical Impression(s) / ED Diagnoses Final diagnoses:  Hypertension, unspecified type    Rx / DC Orders ED Discharge Orders     None        Pattricia Boss, MD 02/21/21 [phone]

## 2021-02-21 NOTE — ED Triage Notes (Signed)
Pt  states that he has had high blood pressure for the last 2 days.

## 2021-02-21 NOTE — Discharge Instructions (Addendum)
Please call your doctor on Monday for recheck in 1 to 2 weeks.

## 2021-04-07 ENCOUNTER — Other Ambulatory Visit: Payer: Self-pay | Admitting: Physician Assistant

## 2021-04-07 DIAGNOSIS — I77810 Thoracic aortic ectasia: Secondary | ICD-10-CM

## 2021-04-23 ENCOUNTER — Ambulatory Visit
Admission: RE | Admit: 2021-04-23 | Discharge: 2021-04-23 | Disposition: A | Discharge status: Home / Self Care | Payer: Medicare Other | Source: Ambulatory Visit | Attending: Physician Assistant | Admitting: Physician Assistant

## 2021-04-23 DIAGNOSIS — I77810 Thoracic aortic ectasia: Secondary | ICD-10-CM

## 2021-04-23 MED ORDER — IOPAMIDOL (ISOVUE-370) INJECTION 76%
75.0000 mL | Freq: Once | INTRAVENOUS | Status: AC | PRN
  Administered 2021-04-23: 75 mL via INTRAVENOUS

## 2021-07-23 ENCOUNTER — Ambulatory Visit: Payer: Medicare Other | Admitting: Adult Health

## 2021-07-30 ENCOUNTER — Ambulatory Visit: Payer: Medicare Other | Admitting: Adult Health

## 2021-08-13 ENCOUNTER — Other Ambulatory Visit: Payer: Self-pay

## 2021-08-13 ENCOUNTER — Ambulatory Visit: Payer: Medicare Other | Admitting: Adult Health

## 2021-08-13 ENCOUNTER — Encounter: Payer: Self-pay | Admitting: Adult Health

## 2021-08-13 DIAGNOSIS — J479 Bronchiectasis, uncomplicated: Secondary | ICD-10-CM | POA: Diagnosis not present

## 2021-08-13 DIAGNOSIS — J309 Allergic rhinitis, unspecified: Secondary | ICD-10-CM | POA: Diagnosis not present

## 2021-08-13 NOTE — Progress Notes (Signed)
_0  ID: Jerry Grant, male    DOB: February 07, 1956, 66 y.o.   MRN: [id number]  Chief Complaint  Patient presents with   Follow-up    Referring provider: Heywood Bene, *  HPI: 66 year old male former smoker seen for pulmonary consult February 2020 for chronic cough x2 years.  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  Ectatic 4.3 ascending thoracic aorta.   Sputum AFB June 2020 -NEG   08/13/2021 Follow up ; Bronchiectasis Patient returns for 79-monthfollow-up.  Patient says overall he is doing well.  He denies any flare of cough or wheezing.  Does have an occasional cough with minimal mucus production.  Uses flutter valve few times a week.  No recent antibiotic use. CT chest April 23, 2021 showed no lung consolidation or suspicious nodules. Did have cold like symptoms around Christmas, mild in nature and resolved.   Patient continues to follow with oncology.  Remains in remission from  leukemia.  Remains very active.  Has a horse farm.  Now walking 30 min 3 times a week.   Pneumovax, influenza, COVID-vaccine up-to-date  Retiring at end of month, had his own chemical business.  Going to try to travel some. Plans to do [employer] cruise.      No Known Allergies  Immunization History  Administered Date(s) Administered   DTaP / IPV 07/05/2016, 08/30/2016, 11/22/2016   Fluad Quad(high  Dose 65+) 03/26/2020, 05/25/2021   Influenza Split 05/26/2018   Influenza,inj,Quad PF,6+ Mos 06/28/2015   Influenza,inj,Quad PF,6-35 Mos 04/26/2019   MMR 01/25/2019   PFIZER(Purple Top)SARS-COV-2 Vaccination 10/04/2019, 10/25/2019, 04/25/2020   Pneumococcal Conjugate-13 07/05/2016, 08/30/2016, 11/22/2016   Pneumococcal Polysaccharide-23 10/03/2015, 01/24/2017    Past Medical History:  Diagnosis Date   Chest pain    Diastolic dysfunction    Diverticulosis    Dysrhythmia 07-16-11   '05/'06 issues with rapid heart beat, stress negative.   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 07-08-11, now surgery planned   Shortness of breath 07-16-11   2 weeks ago-testing found related to gastric tumor with bleeding    Tobacco History: Social History   Tobacco Use  Smoking Status Former   Packs/day: 1.00   Years: 10.00   Pack years: 10.00   Types: Cigarettes   Quit date: 07/15/1984   Years since quitting: 37.1  Smokeless Tobacco Never   Counseling given: Not Answered   Outpatient Medications Prior to Visit  Medication Sig Dispense Refill   Calcium Carb-Cholecalciferol (CALCIUM/VITAMIN D PO) Take by mouth.     losartan (COZAAR) 25 MG tablet Take 1 tablet (25 mg total) by mouth daily. 30 tablet 0   Multiple Vitamin (MULTI-VITAMIN PO) Take by mouth.     No facility-administered medications prior to visit.     Review of Systems:   Constitutional:   No  weight loss, night sweats,  Fevers, chills, fatigue, or  lassitude.  HEENT:   No headaches,  Difficulty swallowing,  Tooth/dental problems, or  Sore throat,                No sneezing, itching, ear ache, nasal congestion, post nasal drip,   CV:  No chest pain,  Orthopnea, PND, swelling in lower extremities, anasarca, dizziness, palpitations, syncope.   GI  No heartburn, indigestion, abdominal pain, nausea, vomiting, diarrhea, change in bowel habits, loss of appetite, bloody stools.   Resp:  No shortness of breath with exertion or at rest.  No excess mucus, no productive cough,  No non-productive cough,  No coughing up of blood.  No change in color of mucus.  No wheezing.  No chest wall deformity  Skin: no rash or lesions.  GU: no dysuria, change in color of urine, no urgency or frequency.  No flank pain, no hematuria   MS:  No joint pain or swelling.  No decreased range of motion.  No back pain.    Physical Exam  BP 110/84 (BP Location: Left Arm, Patient Position: Sitting, Cuff Size: Normal)    Pulse 88    Temp 98.4 F (36.9 C) (Oral)    Ht _0  (1.854 m)    Wt 191 lb 3.2 oz (86.7 kg)    SpO2 96%    BMI 25.23 kg/m   GEN: A/Ox3; pleasant , NAD, well nourished    HEENT:  [place]/AT,   NOSE-clear, THROAT-clear, no lesions, no postnasal drip or exudate noted.   NECK:  Supple w/ fair ROM; no JVD; normal carotid impulses w/o bruits; no thyromegaly or nodules palpated; no lymphadenopathy.    RESP  Clear  P & A; w/o, wheezes/ rales/ or rhonchi. no accessory muscle use, no dullness to percussion  CARD:  RRR, no m/r/g, no peripheral edema, pulses intact, no cyanosis or clubbing.  GI:   Soft & nt; nml bowel sounds; no organomegaly or masses detected.   Musco: Warm bil, no deformities or joint swelling noted.   Neuro: alert, no focal deficits noted.    Skin: Warm, no lesions or rashes    Lab Results:  CBC    BNP No results found for: BNP  ProBNP No results found for: PROBNP  Imaging: No results found.    No flowsheet data found.  No results found for: NITRICOXIDE      Assessment & Plan:   Bronchiectasis ([place]) Doing very well , no recent flare.  Continue to control triggers  Activity as tolerated   Plan  Patient Instructions  Mucinex Twice daily  As needed  Cough/congestion  Flutter valve Twice daily   Saline nasal rinses Twice daily   Flonase 1 spray in each nostril nightly As needed  Drainage .  Claritin 58m in am As needed  Drainage .  GERD  diet.  Activity as tolerated.  Follow up with Dr. SHalford Chessman Or Eustace Hur NP in 1 year and As needed.        Allergic rhinitis Controlled no changes      TRexene Edison NP 08/13/2021

## 2021-08-13 NOTE — Assessment & Plan Note (Signed)
Doing very well , no recent flare.  Continue to control triggers  Activity as tolerated   Plan  Patient Instructions  Mucinex Twice daily  As needed  Cough/congestion  Flutter valve Twice daily   Saline nasal rinses Twice daily   Flonase 1 spray in each nostril nightly As needed  Drainage .  Claritin 10mg  in am As needed  Drainage .  GERD diet.  Activity as tolerated.  Follow up with Dr. Halford Chessman  Or Adelin Ventrella NP in 1 year and As needed.

## 2021-08-13 NOTE — Assessment & Plan Note (Signed)
Controlled no changes 

## 2021-08-13 NOTE — Patient Instructions (Addendum)
Mucinex Twice daily  As needed  Cough/congestion  Flutter valve Twice daily   Saline nasal rinses Twice daily   Flonase 1 spray in each nostril nightly As needed  Drainage .  Claritin 10mg  in am As needed  Drainage .  GERD diet.  Activity as tolerated.  Follow up with Dr. Halford Chessman  Or Rebecca Cairns NP in 1 year and As needed.

## 2021-08-13 NOTE — Progress Notes (Signed)
Reviewed and agree with assessment/plan. ° ° °Emmersyn Kratzke, MD °[facility]/Critical [facility] °08/13/2021, 7:24 PM °Pager:  [phone] ° °

## 2021-11-25 DIAGNOSIS — N529 Male erectile dysfunction, unspecified: Secondary | ICD-10-CM | POA: Diagnosis not present

## 2021-11-25 DIAGNOSIS — Z0000 Encounter for general adult medical examination without abnormal findings: Secondary | ICD-10-CM | POA: Diagnosis not present

## 2021-11-25 DIAGNOSIS — Z20822 Contact with and (suspected) exposure to covid-19: Secondary | ICD-10-CM | POA: Diagnosis not present

## 2021-11-25 DIAGNOSIS — I10 Essential (primary) hypertension: Secondary | ICD-10-CM | POA: Diagnosis not present

## 2022-01-08 DIAGNOSIS — C9101 Acute lymphoblastic leukemia, in remission: Secondary | ICD-10-CM | POA: Diagnosis not present

## 2022-01-08 DIAGNOSIS — C9100 Acute lymphoblastic leukemia not having achieved remission: Secondary | ICD-10-CM | POA: Diagnosis not present

## 2022-01-08 DIAGNOSIS — Z9481 Bone marrow transplant status: Secondary | ICD-10-CM | POA: Diagnosis not present

## 2022-03-31 DIAGNOSIS — H5310 Unspecified subjective visual disturbances: Secondary | ICD-10-CM | POA: Diagnosis not present

## 2022-04-19 ENCOUNTER — Other Ambulatory Visit: Payer: Self-pay | Admitting: Physician Assistant

## 2022-04-19 DIAGNOSIS — I77810 Thoracic aortic ectasia: Secondary | ICD-10-CM

## 2022-04-20 DIAGNOSIS — Z23 Encounter for immunization: Secondary | ICD-10-CM | POA: Diagnosis not present

## 2022-05-13 ENCOUNTER — Ambulatory Visit
Admission: RE | Admit: 2022-05-13 | Discharge: 2022-05-13 | Disposition: A | Discharge status: Home / Self Care | Payer: Medicare Other | Source: Ambulatory Visit | Attending: Physician Assistant | Admitting: Physician Assistant

## 2022-05-13 DIAGNOSIS — I712 Thoracic aortic aneurysm, without rupture, unspecified: Secondary | ICD-10-CM | POA: Diagnosis not present

## 2022-05-13 DIAGNOSIS — Z8572 Personal history of non-Hodgkin lymphomas: Secondary | ICD-10-CM | POA: Diagnosis not present

## 2022-05-13 DIAGNOSIS — I77810 Thoracic aortic ectasia: Secondary | ICD-10-CM

## 2022-05-13 MED ORDER — IOPAMIDOL (ISOVUE-370) INJECTION 76%
75.0000 mL | Freq: Once | INTRAVENOUS | Status: AC | PRN
  Administered 2022-05-13: 75 mL via INTRAVENOUS

## 2022-05-15 IMAGING — CT CT ANGIO CHEST
1 series · 18 of 32 positions shown · IV contrast (APPLIED)
Comparison: CT chest, 12/19/2018

CLINICAL DATA: Follow-up thoracic aortic ectasia common pneumonia,
persistent cough, history of leukemia

EXAM:
CT ANGIOGRAPHY CHEST WITH CONTRAST
TECHNIQUE: Multidetector CT imaging of the chest was performed using the
standard protocol during bolus administration of intravenous
contrast. Multiplanar CT image reconstructions and MIPs were
obtained to evaluate the vascular anatomy.
CONTRAST:  75mL [id number] IOPAMIDOL ([id number]) INJECTION 76%

[Series 4: chest angio · axial · 0.75mm/px · z∈[-332,+28]mm · 18 of 130 slices shown]
[im 5/130  lung]
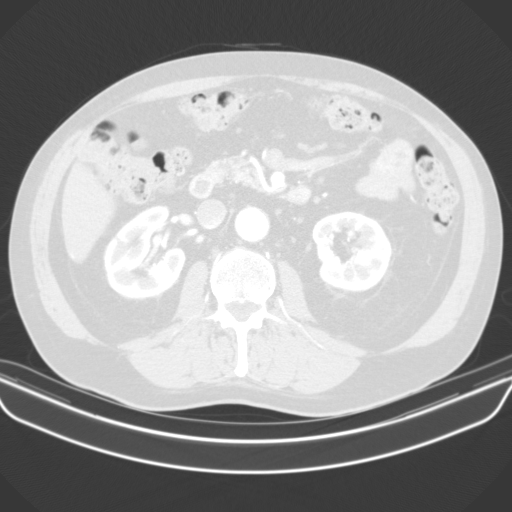
[im 13/130  soft-tissue]
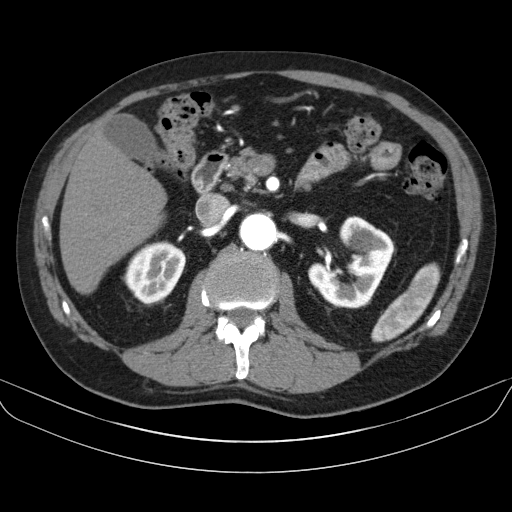
[im 21/130  lung]
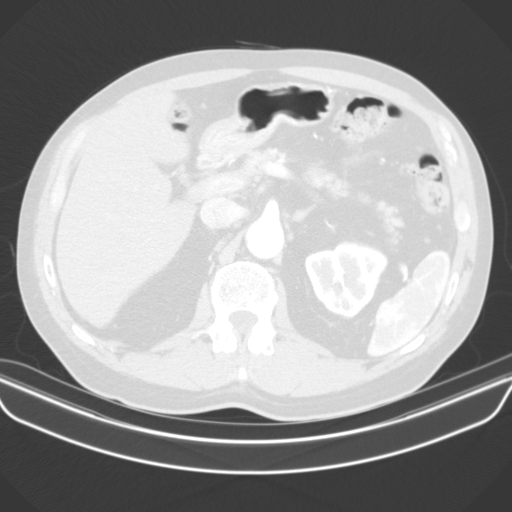
[im 25/130  soft-tissue]
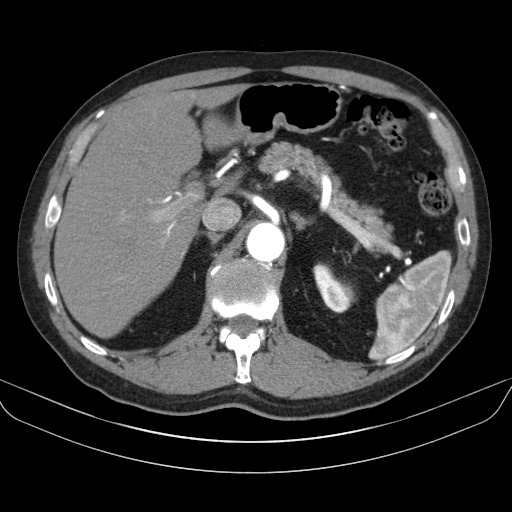
[im 34/130  lung]
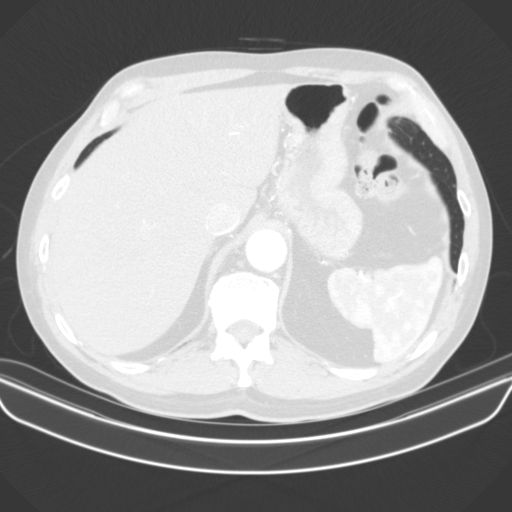
[im 42/130  soft-tissue]
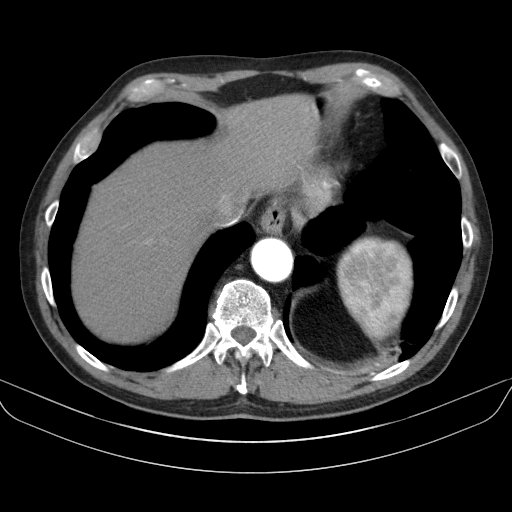
[im 46/130  lung]
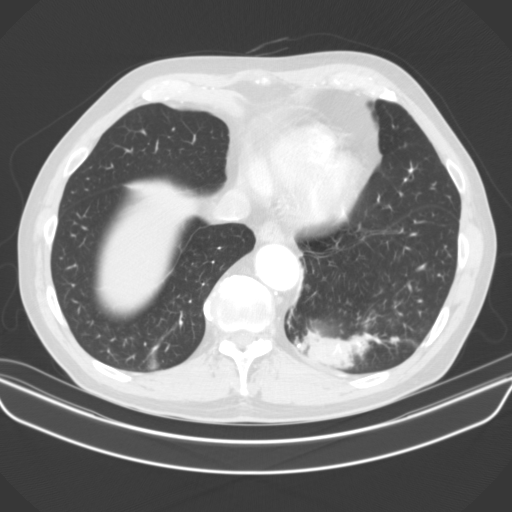
[im 55/130  soft-tissue]
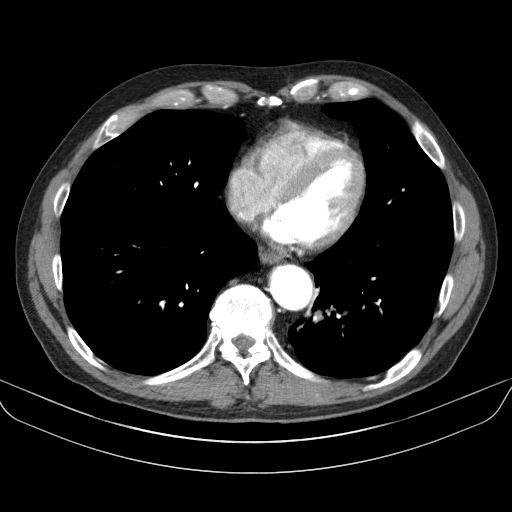
[im 63/130  lung]
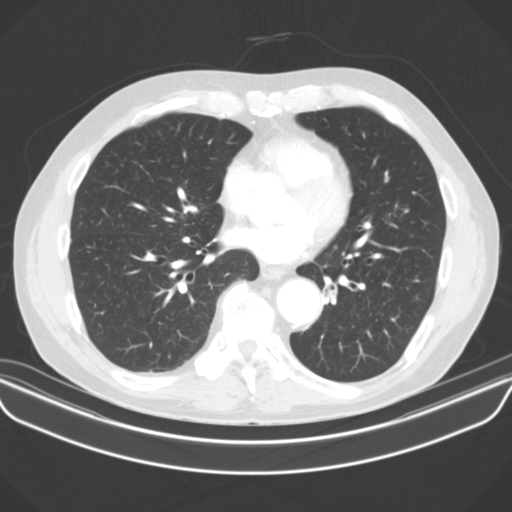
[im 67/130  soft-tissue]
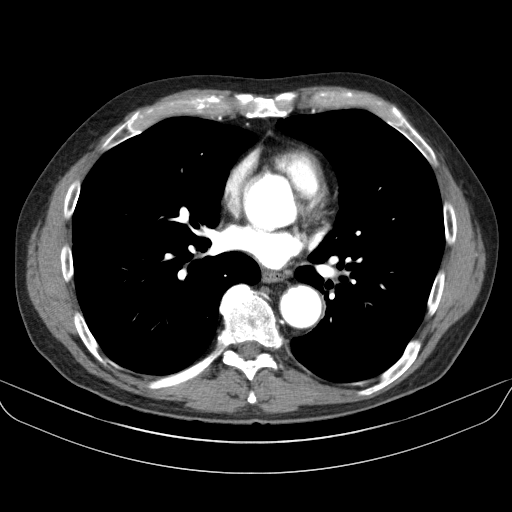
[im 75/130  lung]
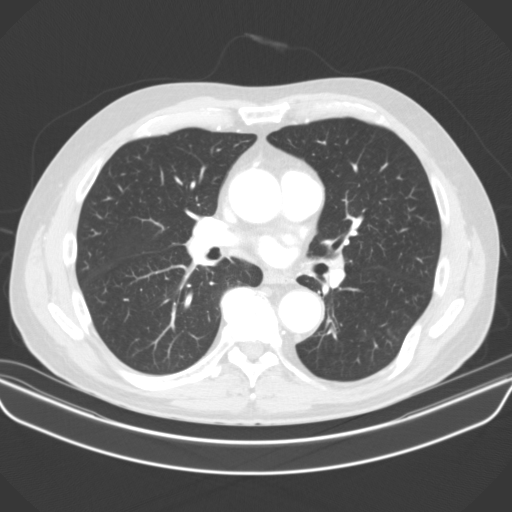
[im 84/130  soft-tissue]
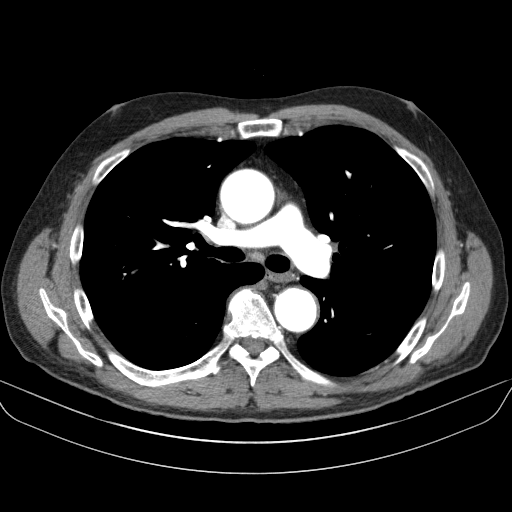
[im 88/130  lung]
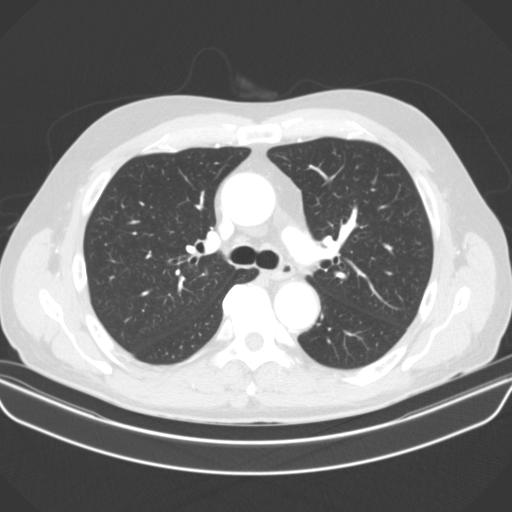
[im 96/130  soft-tissue]
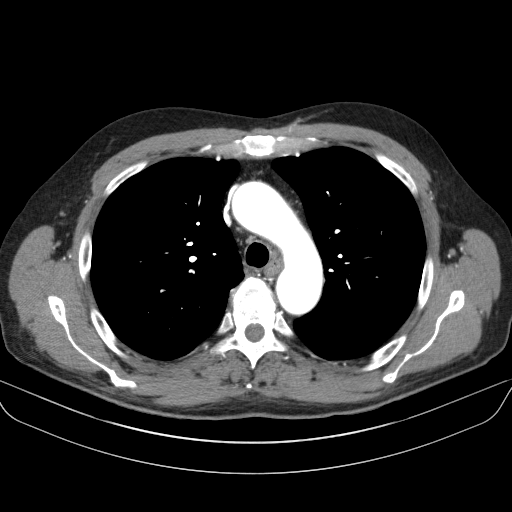
[im 105/130  lung]
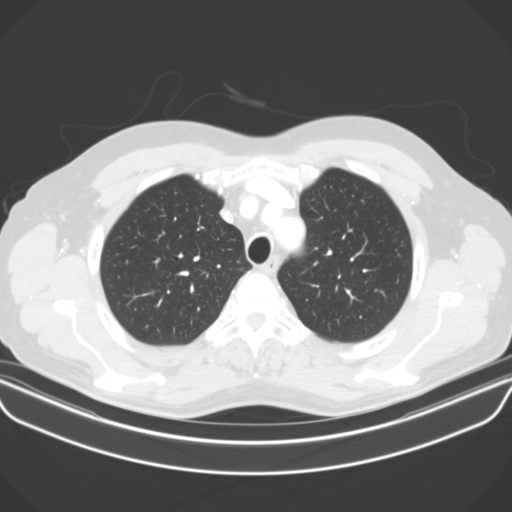
[im 109/130  soft-tissue]
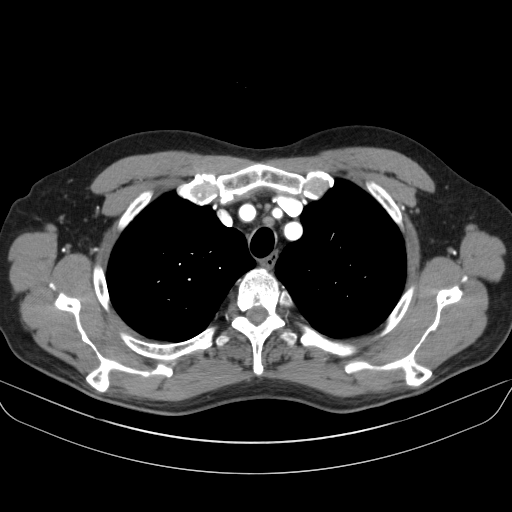
[im 117/130  lung]
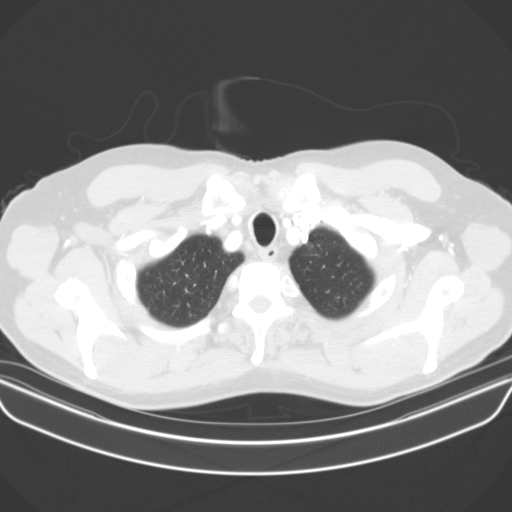
[im 125/130  soft-tissue]
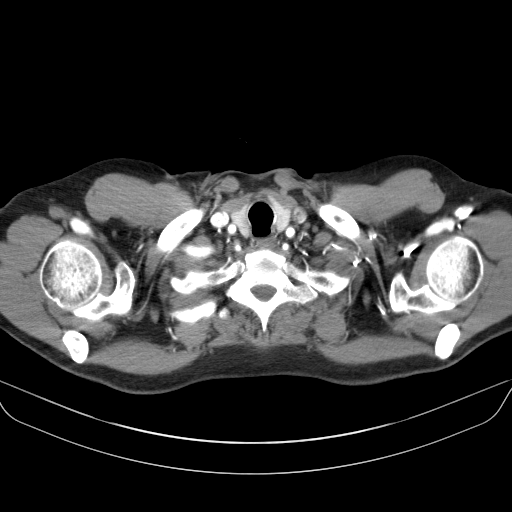

[18 of 32 positions shown; findings below may reference images not displayed]

FINDINGS: Cardiovascular: Preferential opacification of the thoracic aorta. No
significant change in enlargement of the tubular ascending thoracic
aorta, measuring up to 4.1 x 4.1 cm. The aortic valve measures
cm and the sinuses of Valsalva measure 4.1 cm. The proximal
descending thoracic aorta is enlarged measuring up to 3.3 x 3.3 cm,
and the distal descending thoracic aorta measures up to 3.0 x
cm, unchanged. Mild, irregular noncalcified atherosclerosis. Normal
heart size. No pericardial effusion.

Mediastinum/Nodes: No enlarged mediastinal, hilar, or axillary lymph
nodes. Thyroid gland, trachea, and esophagus demonstrate no
significant findings.

Lungs/Pleura: There is extensive bronchial wall thickening and
plugging of the left lower lobe bronchi with bandlike consolidation
and clustered centrilobular nodularity in the left lung base (series
7, image 125). There is an additional, minimal bronchial plugging
and clustered nodularity in the peripheral right upper lobe (series
7, image 78). No pleural effusion or pneumothorax.

Upper Abdomen: No acute abnormality.

Musculoskeletal: No chest wall abnormality. No acute or significant
osseous findings.

Review of the MIP images confirms the above findings.
IMPRESSION: 1. No significant change in enlargement of the tubular ascending
thoracic aorta, measuring up to 4.1 x 4.1 cm. The proximal
descending thoracic aorta is enlarged measuring up to 3.3 x 3.3 cm,
and the distal descending thoracic aorta measures up to 3.0 x
cm. Recommend annual imaging followup by CTA or MRA. This
recommendation follows 8939
ACCF/AHA/AATS/ACR/ASA/SCA/KUANG YU/MINANI ETIENNE/QUIRIJN/LOPEZ BAYAS Guidelines for the
Diagnosis and Management of Patients with Thoracic Aortic Disease.
Circulation. 8939; 121: E266-e369. Aortic aneurysm NOS (6O3OY-PUA.F)
2. Mild, irregular noncalcified atherosclerosis. Aortic
Atherosclerosis (6O3OY-ZHL.L).
3. There is extensive bronchial wall thickening and plugging of the
left lower lobe bronchi with bandlike consolidation and clustered
centrilobular nodularity in the left lung base. There is an
additional, minimal bronchial plugging and clustered nodularity in
the peripheral right upper lobe. Findings are consistent with
multifocal infection, including atypical infection such as atypical
mycobacterium.

## 2022-05-28 DIAGNOSIS — Z23 Encounter for immunization: Secondary | ICD-10-CM | POA: Diagnosis not present

## 2022-05-28 DIAGNOSIS — N529 Male erectile dysfunction, unspecified: Secondary | ICD-10-CM | POA: Diagnosis not present

## 2022-05-28 DIAGNOSIS — I10 Essential (primary) hypertension: Secondary | ICD-10-CM | POA: Diagnosis not present

## 2022-05-28 DIAGNOSIS — R1033 Periumbilical pain: Secondary | ICD-10-CM | POA: Diagnosis not present

## 2022-08-02 DIAGNOSIS — R059 Cough, unspecified: Secondary | ICD-10-CM | POA: Diagnosis not present

## 2022-08-02 DIAGNOSIS — K5730 Diverticulosis of large intestine without perforation or abscess without bleeding: Secondary | ICD-10-CM | POA: Diagnosis not present

## 2022-08-02 DIAGNOSIS — Z86010 Personal history of colonic polyps: Secondary | ICD-10-CM | POA: Diagnosis not present

## 2022-08-31 DIAGNOSIS — H5213 Myopia, bilateral: Secondary | ICD-10-CM | POA: Diagnosis not present

## 2022-09-02 DIAGNOSIS — K5730 Diverticulosis of large intestine without perforation or abscess without bleeding: Secondary | ICD-10-CM | POA: Diagnosis not present

## 2022-09-02 DIAGNOSIS — K635 Polyp of colon: Secondary | ICD-10-CM | POA: Diagnosis not present

## 2022-09-02 DIAGNOSIS — D128 Benign neoplasm of rectum: Secondary | ICD-10-CM | POA: Diagnosis not present

## 2022-09-02 DIAGNOSIS — K6389 Other specified diseases of intestine: Secondary | ICD-10-CM | POA: Diagnosis not present

## 2022-09-02 DIAGNOSIS — K621 Rectal polyp: Secondary | ICD-10-CM | POA: Diagnosis not present

## 2022-09-02 DIAGNOSIS — Z86010 Personal history of colonic polyps: Secondary | ICD-10-CM | POA: Diagnosis not present

## 2022-09-06 ENCOUNTER — Encounter: Payer: Self-pay | Admitting: Adult Health

## 2022-09-06 ENCOUNTER — Ambulatory Visit: Payer: Medicare Other | Admitting: Adult Health

## 2022-09-06 VITALS — BP 130/84 | HR 77 | Temp 97.9°F | Ht 73.0 in | Wt 194.2 lb

## 2022-09-06 DIAGNOSIS — J479 Bronchiectasis, uncomplicated: Secondary | ICD-10-CM

## 2022-09-06 DIAGNOSIS — J309 Allergic rhinitis, unspecified: Secondary | ICD-10-CM

## 2022-09-06 NOTE — Patient Instructions (Addendum)
Mucinex Twice daily  As needed  Cough/congestion  Flutter valve Twice daily   Saline nasal rinses Twice daily   Flonase 1 spray in each nostril nightly As needed  Drainage .  Claritin 77m in am As needed  Drainage .  GERD diet.  Activity as tolerated.  RSV vaccine as discussed   Follow up with Dr. SHalford Chessman Or Kristianne Albin NP in 1 year and As needed.

## 2022-09-06 NOTE — Assessment & Plan Note (Addendum)
Appears stable without any significant symptoms.  Continue with mucociliary clearance with flutter valve and mucolytic's as needed Recent CT chest unremarkable  Plan  Patient Instructions  Mucinex Twice daily  As needed  Cough/congestion  Flutter valve Twice daily   Saline nasal rinses Twice daily   Flonase 1 spray in each nostril nightly As needed  Drainage .  Claritin 80m in am As needed  Drainage .  GERD diet.  Activity as tolerated.  RSV vaccine as discussed   Follow up with Dr. SHalford Chessman Or Crosby Oriordan NP in 1 year and As needed.

## 2022-09-06 NOTE — Progress Notes (Signed)
$@PatientB$  ID: Denver Faster, male    DOB: [birth year], 67 y.o.   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  Ectatic 4.3 ascending thoracic aorta.   Sputum AFB June 2020 -NEG   09/06/2022 Follow up : Bronchiectasis  Patient returns for a 67 year follow-up.  He says overall he is doing well.  He is had no flare of cough or wheezing.  Says in the early fall he did have some increased congestion.  He started using his flutter valve more often and noticed that this helped quite a bit.  He has had no recent antibiotic use.  Denies any discolored mucus. Patient says he is remains very active.  He tries to walk a few times a week.  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 CT angio chest aorta October 2023 showed clear lungs with no adenopathy.,  Stable 4.1 cm ascending thoracic aortic aneurysm.  Has been under some stress this past year.  He retired from his business.  But unfortunately his wife was diagnosed with brain cancer last summer and has been going under extensive treatment.   No Known Allergies  Immunization History  Administered Date(s) Administered   Covid-19, Mrna,Vaccine(Spikevax)68yr and older  05/28/2022   DTaP / IPV 07/05/2016, 08/30/2016, 11/22/2016   Fluad Quad(high Dose 65+) 03/26/2020, 05/25/2021   Hepatitis B, PED/ADOLESCENT 07/05/2016, 08/30/2016, 01/24/2017   Influenza Split 05/26/2018   Influenza, High Dose Seasonal PF 04/20/2022   Influenza,inj,Quad PF,6+ Mos 06/28/2015   Influenza,inj,Quad PF,6-35 Mos 04/26/2019   MMR 01/25/2019   PFIZER(Purple Top)SARS-COV-2 Vaccination 10/04/2019, 10/25/2019, 04/25/2020, 05/30/2020   PNEUMOCOCCAL CONJUGATE-20 04/20/2022   Pfizer Covid-19 Vaccine Bivalent Booster 168yr& up 05/26/2021   Pneumococcal Conjugate-13 07/05/2016, 08/30/2016, 11/22/2016   Pneumococcal Polysaccharide-23 10/03/2015, 01/24/2017   Zoster Recombinat (Shingrix) 03/04/2021, 01/15/2022   Zoster, Unspecified 01/15/2022    Past Medical History:  Diagnosis Date   Chest pain    Diastolic dysfunction    Diverticulosis    Dysrhythmia 07-16-11   '05/'06 issues with rapid heart beat, stress negative.   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     losartan (COZAAR) 25 MG tablet Take 1 tablet (25 mg total) by mouth daily. (Patient taking differently: Take 37.5 mg by mouth daily. 1.5 tabs) 30 tablet 0   Multiple Vitamin (MULTI-VITAMIN PO) Take by mouth.     No facility-administered medications prior to visit.     Review of Systems:   Constitutional:   No  weight loss, night sweats,   Fevers, chills, fatigue, or  lassitude.  HEENT:   No headaches,  Difficulty swallowing,  Tooth/dental problems, or  Sore throat,                No sneezing, itching, ear ache, nasal congestion, post nasal drip,   CV:  No chest pain,  Orthopnea, PND, swelling in lower extremities, anasarca, dizziness, palpitations, syncope.   GI  No heartburn, indigestion, abdominal pain, nausea, vomiting, diarrhea, change in bowel habits, loss of appetite, bloody stools.   Resp: No shortness of breath with exertion or at rest.  No excess mucus, no productive cough,  No non-productive cough,  No coughing up of blood.  No change in color of mucus.  No wheezing.  No chest wall deformity  Skin: no rash or lesions.  GU: no dysuria, change in color of urine, no urgency or frequency.  No flank pain, no hematuria   MS:  No joint pain or swelling.  No decreased range of motion.  No back pain.    Physical Exam  BP 130/84 (BP Location: Left Arm, Patient Position: Sitting, Cuff Size: Normal)   Pulse 77   Temp 97.9 F (36.6 C) (Oral)   Ht 6' 1"$  (1.854 m)   Wt 194 lb 3.2 oz (88.1 kg)   SpO2 99%   BMI 25.62 kg/m   GEN: A/Ox3; pleasant , NAD, well nourished    HEENT:  [place]/AT,  NOSE-clear, THROAT-clear, no lesions, no postnasal drip or exudate noted.   NECK:  Supple w/ fair ROM; no JVD; normal carotid impulses w/o bruits; no thyromegaly or nodules palpated; no lymphadenopathy.    RESP  Clear  P & A; w/o, wheezes/ rales/ or rhonchi. no accessory muscle use, no dullness to percussion  CARD:  RRR, no m/r/g, no peripheral edema, pulses intact, no cyanosis or clubbing.  GI:   Soft & nt; nml bowel sounds; no organomegaly or masses detected.   Musco: Warm bil, no deformities or joint swelling noted.   Neuro: alert, no focal deficits noted.    Skin: Warm, no lesions or rashes    Lab Results:  CBC     BNP No results found for: "BNP"  ProBNP No results found for: "PROBNP"  Imaging: No results  found.        No data to display          No results found for: "NITRICOXIDE"      Assessment & Plan:   Bronchiectasis ([place]) Appears stable without any significant symptoms.  Continue with mucociliary clearance with flutter valve and mucolytic's as needed Recent CT chest unremarkable  Plan  Patient Instructions  Mucinex Twice daily  As needed  Cough/congestion  Flutter valve Twice daily   Saline nasal rinses Twice daily   Flonase 1 spray in each nostril nightly As needed  Drainage .  Claritin 2m in am As needed  Drainage .  GERD diet.  Activity as tolerated.  RSV vaccine as discussed   Follow up with Dr. SHalford Chessman Or Jamee Pacholski NP in 1 year  and As needed.      Allergic rhinitis Continue on trigger prevention continue on your current regimen    Rexene Edison, NP 09/06/2022

## 2022-09-06 NOTE — Progress Notes (Signed)
Reviewed and agree with assessment/plan.   [name], MD [facility] Pulmonary/Critical Care 09/06/2022, 5:42 PM Pager:  [phone]

## 2022-09-06 NOTE — Assessment & Plan Note (Signed)
Continue on trigger prevention continue on your current regimen

## 2022-09-17 DIAGNOSIS — R059 Cough, unspecified: Secondary | ICD-10-CM | POA: Diagnosis not present

## 2022-09-17 DIAGNOSIS — J020 Streptococcal pharyngitis: Secondary | ICD-10-CM | POA: Diagnosis not present

## 2022-09-17 DIAGNOSIS — Z6827 Body mass index (BMI) 27.0-27.9, adult: Secondary | ICD-10-CM | POA: Diagnosis not present

## 2022-09-17 DIAGNOSIS — R062 Wheezing: Secondary | ICD-10-CM | POA: Diagnosis not present

## 2022-09-17 DIAGNOSIS — J111 Influenza due to unidentified influenza virus with other respiratory manifestations: Secondary | ICD-10-CM | POA: Diagnosis not present

## 2022-11-12 DIAGNOSIS — Z20822 Contact with and (suspected) exposure to covid-19: Secondary | ICD-10-CM | POA: Diagnosis not present

## 2022-11-12 DIAGNOSIS — R051 Acute cough: Secondary | ICD-10-CM | POA: Diagnosis not present

## 2022-11-12 DIAGNOSIS — J069 Acute upper respiratory infection, unspecified: Secondary | ICD-10-CM | POA: Diagnosis not present

## 2022-12-03 ENCOUNTER — Emergency Department (HOSPITAL_BASED_OUTPATIENT_CLINIC_OR_DEPARTMENT_OTHER): Payer: Medicare Other

## 2022-12-03 ENCOUNTER — Other Ambulatory Visit (HOSPITAL_BASED_OUTPATIENT_CLINIC_OR_DEPARTMENT_OTHER): Payer: Self-pay

## 2022-12-03 ENCOUNTER — Other Ambulatory Visit: Payer: Self-pay

## 2022-12-03 ENCOUNTER — Emergency Department (HOSPITAL_BASED_OUTPATIENT_CLINIC_OR_DEPARTMENT_OTHER)
Admission: EM | Admit: 2022-12-03 | Discharge: 2022-12-03 | Disposition: A | Discharge status: Home / Self Care | Payer: Medicare Other | Attending: Emergency Medicine | Admitting: Emergency Medicine

## 2022-12-03 DIAGNOSIS — S0990XA Unspecified injury of head, initial encounter: Secondary | ICD-10-CM

## 2022-12-03 DIAGNOSIS — W01198A Fall on same level from slipping, tripping and stumbling with subsequent striking against other object, initial encounter: Secondary | ICD-10-CM | POA: Diagnosis not present

## 2022-12-03 DIAGNOSIS — Y92000 Kitchen of unspecified non-institutional (private) residence as  the place of occurrence of the external cause: Secondary | ICD-10-CM | POA: Diagnosis not present

## 2022-12-03 DIAGNOSIS — S0011XA Contusion of right eyelid and periocular area, initial encounter: Secondary | ICD-10-CM | POA: Diagnosis not present

## 2022-12-03 DIAGNOSIS — R221 Localized swelling, mass and lump, neck: Secondary | ICD-10-CM | POA: Diagnosis not present

## 2022-12-03 DIAGNOSIS — R220 Localized swelling, mass and lump, head: Secondary | ICD-10-CM | POA: Diagnosis not present

## 2022-12-03 DIAGNOSIS — Y9389 Activity, other specified: Secondary | ICD-10-CM | POA: Insufficient documentation

## 2022-12-03 NOTE — Discharge Instructions (Addendum)
It was a pleasure taking care of you today!  Your CT scans didn't show any emergent findings today. At home, you may continue with ice to the area for up to 15-20 minutes, ensure to place a barrier between your skin and your ice. You may take over the counter tylenol as needed for your symptoms. You may follow up with your primary care provider regarding todays ED visit. Return to the ED if you are experiencing increasing/worsening symptoms.

## 2022-12-03 NOTE — ED Triage Notes (Signed)
Pt arrived POV, ambulatory in triage, caox4, NAD reporting a fall last Wednesday 5/1 slipping in the kitchen causing him to hit his face on the kitchen counter. Pt denies LOC, denies dizziness, visual changes, numbness/weakness or any other complaints. Ecchymosis to R side. Pt does not take blood thinner medication.

## 2022-12-03 NOTE — ED Provider Notes (Signed)
[place] EMERGENCY DEPARTMENT AT [facility] Provider Note   CSN: [id number] Arrival date & time: 12/03/22  1018     History  Chief Complaint  Patient presents with   Head Injury    Jerry Grant is a 67 y.o. male who presents emergency department with concerns for head injury onset 11/24/2022.  Patient notes that he was playing with his grandchildren hardwood floor with socks on when he slipped and hit his head.  Denies anticoagulant use.  Has tried ice at home for symptoms.  Denies LOC, dizziness, vision changes, numbness, tingling, chest pain, shortness of breath, abdominal pain, nausea, vomiting, neck, back pain.  The history is provided by the patient. No language interpreter was used.       Home Medications Prior to Admission medications   Medication Sig Start Date End Date Taking? Authorizing Provider  Calcium Carb-Cholecalciferol (CALCIUM/VITAMIN D PO) Take by mouth.    [provider]  losartan (COZAAR) 25 MG tablet Take 1 tablet (25 mg total) by mouth daily. Patient taking differently: Take 37.5 mg by mouth daily. 1.5 tabs 02/21/21   Margarita Grizzle, MD  Multiple Vitamin (MULTI-VITAMIN PO) Take by mouth.    [provider]      Allergies    Patient has no known allergies.    Review of Systems   Review of Systems  All other systems reviewed and are negative.   Physical Exam Updated Vital Signs BP 134/85 (BP Location: Left Arm)   Pulse 80   Temp 98.4 F (36.9 C) (Oral)   Resp 18   SpO2 94%  Physical Exam Vitals and nursing note reviewed.  Constitutional:      General: He is not in acute distress.    Appearance: He is not diaphoretic.  HENT:     Head: Normocephalic and atraumatic.     Comments: Various stages of ecchymosis noted to right periorbital region with mild tenderness to palpation noted to the area.    Nose:     Comments: No appreciable septal deviation, tenderness to palpation, septal hematoma, epistaxis noted to  bilateral nares.    Mouth/Throat:     Pharynx: No oropharyngeal exudate.  Eyes:     General: No scleral icterus.    Conjunctiva/sclera: Conjunctivae normal.  Cardiovascular:     Rate and Rhythm: Normal rate and regular rhythm.     Pulses: Normal pulses.     Heart sounds: Normal heart sounds.  Pulmonary:     Effort: Pulmonary effort is normal. No respiratory distress.     Breath sounds: Normal breath sounds. No wheezing.  Abdominal:     General: Bowel sounds are normal.     Palpations: Abdomen is soft. There is no mass.     Tenderness: There is no abdominal tenderness. There is no guarding or rebound.  Musculoskeletal:        General: Normal range of motion.     Cervical back: Normal range of motion and neck supple.     Comments: No spinal tenderness to palpation.  Nontender to palpation noted to musculature of back.  Skin:    General: Skin is warm and dry.  Neurological:     Mental Status: He is alert.  Psychiatric:        Behavior: Behavior normal.     ED Results / Procedures / Treatments   Labs (all labs ordered are listed, but only abnormal results are displayed) Labs Reviewed - No data to display  EKG None  Radiology  CT Head Wo Contrast  Result Date: 12/03/2022 CLINICAL DATA:  Fall hitting face on 11/24/2022. Discoloration and swelling of the right head and face. EXAM: CT HEAD WITHOUT CONTRAST CT MAXILLOFACIAL WITHOUT CONTRAST CT CERVICAL SPINE WITHOUT CONTRAST TECHNIQUE: Multidetector CT imaging of the head, cervical spine, and maxillofacial structures were performed using the standard protocol without intravenous contrast. Multiplanar CT image reconstructions of the cervical spine and maxillofacial structures were also generated. RADIATION DOSE REDUCTION: This exam was performed according to the departmental dose-optimization program which includes automated exposure control, adjustment of the mA and/or kV according to patient size and/or use of iterative reconstruction  technique. COMPARISON:  None Available. FINDINGS: CT HEAD FINDINGS Brain: No evidence of acute infarction, hemorrhage, hydrocephalus, extra-axial collection or mass lesion/mass effect. Vascular: No hyperdense vessel or unexpected calcification. Skull: Focal right frontal scalp swelling. Negative for fracture or focal lesion. Other: None. CT MAXILLOFACIAL FINDINGS Osseous: No fracture or mandibular dislocation. No destructive process. Orbits: Negative. No traumatic or inflammatory finding. Sinuses: Clear. Soft tissues: Focal small right frontal scalp swelling. CT CERVICAL SPINE FINDINGS Alignment: Straightening of the cervical spine Skull base and vertebrae: No acute fracture. No primary bone lesion or focal pathologic process. Soft tissues and spinal canal: No prevertebral fluid or swelling. No visible canal hematoma. Disc levels: C2-C3: Disc height loss and uncovertebral joint arthropathy with mild right neural foraminal stenosis. C3-C4: Disc height loss and uncovertebral joint arthropathy. Moderate left facet joint arthropathy. Mild bilateral neural foraminal stenosis. C4-C5: Disc height loss and uncovertebral joint arthropathy with moderate right neural foraminal stenosis. Moderate right facet joint arthropathy. C5-C6: Disc height loss. No significant spinal canal or neural foraminal stenosis. C6-C7: Disc height loss and uncovertebral joint arthropathy. Mild bilateral neural foraminal stenosis. C7-T1:  No significant findings. Upper chest: Negative. Other: None IMPRESSION: CT head: 1. No acute intracranial abnormality. CT maxillofacial: 1. Focal right frontal scalp swelling, suggesting small hematoma likely sequela of recent trauma. 2. No acute facial bone fracture. CT cervical spine: 1. No acute cervical spine fracture or traumatic subluxation. 2. 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 Electronically Signed   By: Larose Hires D.O.   On: 12/03/2022 11:14   CT  Maxillofacial Wo Contrast  Result Date: 12/03/2022 CLINICAL DATA:  Fall hitting face on 11/24/2022. Discoloration and swelling of the right head and face. EXAM: CT HEAD WITHOUT CONTRAST CT MAXILLOFACIAL WITHOUT CONTRAST CT CERVICAL SPINE WITHOUT CONTRAST TECHNIQUE: Multidetector CT imaging of the head, cervical spine, and maxillofacial structures were performed using the standard protocol without intravenous contrast. Multiplanar CT image reconstructions of the cervical spine and maxillofacial structures were also generated. RADIATION DOSE REDUCTION: This exam was performed according to the departmental dose-optimization program which includes automated exposure control, adjustment of the mA and/or kV according to patient size and/or use of iterative reconstruction technique. COMPARISON:  None Available. FINDINGS: CT HEAD FINDINGS Brain: No evidence of acute infarction, hemorrhage, hydrocephalus, extra-axial collection or mass lesion/mass effect. Vascular: No hyperdense vessel or unexpected calcification. Skull: Focal right frontal scalp swelling. Negative for fracture or focal lesion. Other: None. CT MAXILLOFACIAL FINDINGS Osseous: No fracture or mandibular dislocation. No destructive process. Orbits: Negative. No traumatic or inflammatory finding. Sinuses: Clear. Soft tissues: Focal small right frontal scalp swelling. CT CERVICAL SPINE FINDINGS Alignment: Straightening of the cervical spine Skull base and vertebrae: No acute fracture. No primary bone lesion or focal pathologic process. Soft tissues and spinal canal: No prevertebral fluid or swelling. No visible canal hematoma. Disc levels:  C2-C3: Disc height loss and uncovertebral joint arthropathy with mild right neural foraminal stenosis. C3-C4: Disc height loss and uncovertebral joint arthropathy. Moderate left facet joint arthropathy. Mild bilateral neural foraminal stenosis. C4-C5: Disc height loss and uncovertebral joint arthropathy with moderate right  neural foraminal stenosis. Moderate right facet joint arthropathy. C5-C6: Disc height loss. No significant spinal canal or neural foraminal stenosis. C6-C7: Disc height loss and uncovertebral joint arthropathy. Mild bilateral neural foraminal stenosis. C7-T1:  No significant findings. Upper chest: Negative. Other: None IMPRESSION: CT head: 1. No acute intracranial abnormality. CT maxillofacial: 1. Focal right frontal scalp swelling, suggesting small hematoma likely sequela of recent trauma. 2. No acute facial bone fracture. CT cervical spine: 1. No acute cervical spine fracture or traumatic subluxation. 2. 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 Electronically Signed   By: Larose Hires D.O.   On: 12/03/2022 11:14   CT Cervical Spine Wo Contrast  Result Date: 12/03/2022 CLINICAL DATA:  Fall hitting face on 11/24/2022. Discoloration and swelling of the right head and face. EXAM: CT HEAD WITHOUT CONTRAST CT MAXILLOFACIAL WITHOUT CONTRAST CT CERVICAL SPINE WITHOUT CONTRAST TECHNIQUE: Multidetector CT imaging of the head, cervical spine, and maxillofacial structures were performed using the standard protocol without intravenous contrast. Multiplanar CT image reconstructions of the cervical spine and maxillofacial structures were also generated. RADIATION DOSE REDUCTION: This exam was performed according to the departmental dose-optimization program which includes automated exposure control, adjustment of the mA and/or kV according to patient size and/or use of iterative reconstruction technique. COMPARISON:  None Available. FINDINGS: CT HEAD FINDINGS Brain: No evidence of acute infarction, hemorrhage, hydrocephalus, extra-axial collection or mass lesion/mass effect. Vascular: No hyperdense vessel or unexpected calcification. Skull: Focal right frontal scalp swelling. Negative for fracture or focal lesion. Other: None. CT MAXILLOFACIAL FINDINGS Osseous: No fracture or mandibular  dislocation. No destructive process. Orbits: Negative. No traumatic or inflammatory finding. Sinuses: Clear. Soft tissues: Focal small right frontal scalp swelling. CT CERVICAL SPINE FINDINGS Alignment: Straightening of the cervical spine Skull base and vertebrae: No acute fracture. No primary bone lesion or focal pathologic process. Soft tissues and spinal canal: No prevertebral fluid or swelling. No visible canal hematoma. Disc levels: C2-C3: Disc height loss and uncovertebral joint arthropathy with mild right neural foraminal stenosis. C3-C4: Disc height loss and uncovertebral joint arthropathy. Moderate left facet joint arthropathy. Mild bilateral neural foraminal stenosis. C4-C5: Disc height loss and uncovertebral joint arthropathy with moderate right neural foraminal stenosis. Moderate right facet joint arthropathy. C5-C6: Disc height loss. No significant spinal canal or neural foraminal stenosis. C6-C7: Disc height loss and uncovertebral joint arthropathy. Mild bilateral neural foraminal stenosis. C7-T1:  No significant findings. Upper chest: Negative. Other: None IMPRESSION: CT head: 1. No acute intracranial abnormality. CT maxillofacial: 1. Focal right frontal scalp swelling, suggesting small hematoma likely sequela of recent trauma. 2. No acute facial bone fracture. CT cervical spine: 1. No acute cervical spine fracture or traumatic subluxation. 2. 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 Electronically Signed   By: Larose Hires D.O.   On: 12/03/2022 11:14    Procedures Procedures    Medications Ordered in ED Medications - No data to display  ED Course/ Medical Decision Making/ A&P Clinical Course as of 12/03/22 1121  Fri Dec 03, 2022  1119 Re-evaluated and patient reading comfortably in the bed. Discussed with patient imaging findings. Discussed discharge treatment plan. Pt agreeable at this time. Pt appears safe for discharge. [SB]  Clinical Course  User Index [SB] Lezly Rumpf A, PA-C                             Medical Decision Making Amount and/or Complexity of Data Reviewed Radiology: ordered.   Pt with fall occurring 11/24/22. No thinners at this time. Pt afebrile. On exam, patient with No spinal tenderness to palpation.  Nontender to palpation noted to musculature of back. No appreciable septal deviation, tenderness to palpation, septal hematoma, epistaxis noted to bilateral nares. Various stages of ecchymosis noted to right periorbital region with mild tenderness to palpation noted to the area. No acute cardiovascular, respiratory, or abdominal exam findings. Differential diagnosis includes fracture, herniation, dislocation, contusion.     Imaging: I ordered imaging studies including CT head/cervical/maxillofacial  I independently visualized and interpreted imaging which showed:  CT head:    1. No acute intracranial abnormality.    CT maxillofacial:    1. Focal right frontal scalp swelling, suggesting small hematoma  likely sequela of recent trauma.  2. No acute facial bone fracture.    CT cervical spine:    1. No acute cervical spine fracture or traumatic subluxation.  2. Moderate multilevel degenerative disc and joint disease of the  cervical spine.   I agree with the radiologist interpretation   Disposition: Pt presentation suspicious for head injury. Doubt concerns at this time for fracture, dislocation, herniation at this time. After consideration of the diagnostic results and the patients response to treatment, I feel that the patient would benefit from Discharge home.  Supportive care and return precautions discussed with patient. Appears safe for discharge at this time. Follow up as indicated in discharge paperwork.   This chart was dictated using voice recognition software, Dragon. Despite the best efforts of this provider to proofread and correct errors, errors may still occur which can change documentation  meaning.  Final Clinical Impression(s) / ED Diagnoses Final diagnoses:  Injury of head, initial encounter    Rx / DC Orders ED Discharge Orders     None         Ainsleigh Kakos A, PA-C 12/03/22 1121    [place], Adam, DO 12/03/22 1233

## 2022-12-31 DIAGNOSIS — Z0000 Encounter for general adult medical examination without abnormal findings: Secondary | ICD-10-CM | POA: Diagnosis not present

## 2022-12-31 DIAGNOSIS — I7121 Aneurysm of the ascending aorta, without rupture: Secondary | ICD-10-CM | POA: Diagnosis not present

## 2022-12-31 DIAGNOSIS — I10 Essential (primary) hypertension: Secondary | ICD-10-CM | POA: Diagnosis not present

## 2022-12-31 DIAGNOSIS — Z5181 Encounter for therapeutic drug level monitoring: Secondary | ICD-10-CM | POA: Diagnosis not present

## 2022-12-31 DIAGNOSIS — Z13220 Encounter for screening for lipoid disorders: Secondary | ICD-10-CM | POA: Diagnosis not present

## 2022-12-31 DIAGNOSIS — Z125 Encounter for screening for malignant neoplasm of prostate: Secondary | ICD-10-CM | POA: Diagnosis not present

## 2022-12-31 DIAGNOSIS — N529 Male erectile dysfunction, unspecified: Secondary | ICD-10-CM | POA: Diagnosis not present

## 2023-01-17 DIAGNOSIS — N529 Male erectile dysfunction, unspecified: Secondary | ICD-10-CM | POA: Diagnosis not present

## 2023-03-18 DIAGNOSIS — D229 Melanocytic nevi, unspecified: Secondary | ICD-10-CM | POA: Diagnosis not present

## 2023-03-18 DIAGNOSIS — L821 Other seborrheic keratosis: Secondary | ICD-10-CM | POA: Diagnosis not present

## 2023-06-06 DIAGNOSIS — K080 Exfoliation of teeth due to systemic causes: Secondary | ICD-10-CM | POA: Diagnosis not present

## 2023-07-15 DIAGNOSIS — H6993 Unspecified Eustachian tube disorder, bilateral: Secondary | ICD-10-CM | POA: Diagnosis not present

## 2023-07-15 DIAGNOSIS — R062 Wheezing: Secondary | ICD-10-CM | POA: Diagnosis not present

## 2023-07-15 DIAGNOSIS — J029 Acute pharyngitis, unspecified: Secondary | ICD-10-CM | POA: Diagnosis not present

## 2023-07-15 DIAGNOSIS — Z20822 Contact with and (suspected) exposure to covid-19: Secondary | ICD-10-CM | POA: Diagnosis not present

## 2023-08-23 DIAGNOSIS — K080 Exfoliation of teeth due to systemic causes: Secondary | ICD-10-CM | POA: Diagnosis not present

## 2023-09-07 ENCOUNTER — Ambulatory Visit: Payer: Medicare Other | Admitting: Adult Health

## 2023-09-09 DIAGNOSIS — H5213 Myopia, bilateral: Secondary | ICD-10-CM | POA: Diagnosis not present

## 2023-10-25 ENCOUNTER — Ambulatory Visit

## 2023-10-25 ENCOUNTER — Ambulatory Visit: Payer: Medicare Other | Admitting: Adult Health

## 2023-10-25 ENCOUNTER — Encounter: Payer: Self-pay | Admitting: Adult Health

## 2023-10-25 VITALS — BP 120/72 | HR 99 | Ht 73.0 in | Wt 193.4 lb

## 2023-10-25 DIAGNOSIS — J479 Bronchiectasis, uncomplicated: Secondary | ICD-10-CM

## 2023-10-25 NOTE — Patient Instructions (Addendum)
 Chest xray today.  Mucinex Twice daily  As needed  Cough/congestion  Flutter valve Twice daily   Saline nasal rinses Twice daily   Flonase 1 spray in each nostril nightly As needed  Drainage .  Claritin 10mg  in am As needed  Drainage .  GERD diet.  Activity as tolerated.  Follow up with Dr. Francine Graven Or Dianne Bady NP in 1 year and As needed.  -with PFT (30 min slot)

## 2023-10-25 NOTE — Progress Notes (Signed)
 @Patient  ID: Jerry Grant, male    DOB: 11-05-1955, 68 y.o.   MRN: [id number]  No chief complaint on file.   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 PICC line associated bacteremia in 2017, influenza pneumonia in 2018 and [place] pneumonia and 2020 Owns a horse farm  TEST/EVENTS :  PFT 01/05/18 >>FEV1 3.37 (94%), FEV1% 74, TLC 7.50 (104%), DLCO 88%   CT chest 09/21/16 >> micronodularity and GGO in Lt and Rt lungs likely from viral  pneumonia   Echo 01/24/17 >> EF 60 to 65%   High-resolution CT chest May 2020 showed minimum bronchiectasis with mild patchy tree-in-bud opacity in the lower lungs.  Ectatic 4.3 ascending thoracic aorta.   Sputum AF[employer] June 2020 -NEG   10/25/2023 Follow up : Bronchiectasis  Discussed the use of AI scribe software for clinical note transcription with the patient, who gave verbal consent to proceed.  History of Present Illness   Jerry Grant is a 68 year old male with bronchiectasis who presents for a one-year follow-up visit.  He has a history of bronchiectasis and continues to experience a productive cough at times, along with postnasal drip. He manages these symptoms with daily Claritin, Flonase as needed, and intermittent use of Mucinex. He recalls a respiratory viral illness in December or January, which was treated with a five-day course of steroids.  Symptoms did improve.  No recent imaging has been performed since his last visit a year ago.  No recent antibiotic use.  He recently returned from a trip to [place] and experienced extreme fatigue, describing it as feeling like he had been 'run over by a bus.' He attributes this to either a viral illness or possibly jet lag. No  fever, chest pain, hemoptysis, calf pain, or swelling. No significant changes in his cough or congestion beyond his usual symptoms.  He remains active, walking regularly, including during his trips to [place]. He uses a flutter valve intermittently to manage mucus production. He has not had a pulmonary function test in over five years.   Patient has been under a lot of stress over the last couple years as his wife was diagnosed with brain cancer-glioblastoma.  She is followed at [facility] oncology and is currently traveling to [place] every month for treatments.  Currently doing very well and is treatment responsive.      No Known Allergies  Immunization History  Administered Date(s) Administered   DTaP / IPV 07/05/2016, 08/30/2016, 11/22/2016   Fluad Quad(high Dose 65+) 03/26/2020, 05/25/2021   Hepatitis B, PED/ADOLESCENT 07/05/2016, 08/30/2016, 01/24/2017   Influenza Split 05/26/2018   Influenza, High Dose Seasonal PF 04/20/2022   Influenza,inj,Quad PF,6+ Mos 06/28/2015   Influenza,inj,Quad PF,6-35 Mos 04/26/2019   MMR 01/25/2019   Moderna Covid-19 Fall Seasonal Vaccine 51yrs & older 05/28/2022   PFIZER(Purple Top)SARS-COV-2 Vaccination 10/04/2019, 10/25/2019, 04/25/2020, 05/30/2020   PNEUMOCOCCAL CONJUGATE-20 04/20/2022   Pfizer Covid-19 Vaccine Bivalent Booster 17yrs & up 05/26/2021   Pneumococcal Conjugate-13 07/05/2016, 08/30/2016, 11/22/2016   Pneumococcal Polysaccharide-23 10/03/2015, 01/24/2017   Zoster Recombinant(Shingrix) 03/04/2021, 01/15/2022   Zoster, Unspecified 01/15/2022    Past Medical History:  Diagnosis Date   Chest pain    Diastolic dysfunction    Diverticulosis    Dysrhythmia 07-16-11   '05/'06 issues with  rapid heart beat, stress negative.   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     losartan (COZAAR) 25 MG tablet Take 1 tablet (25 mg total) by mouth daily. (Patient taking differently: Take 37.5 mg by mouth daily. 1.5 tabs) 30 tablet 0   Multiple Vitamin (MULTI-VITAMIN PO) Take by mouth.     No facility-administered medications prior to visit.     Review of Systems:   Constitutional:   No  weight loss, night sweats,  Fevers, chills, +fatigue, or  lassitude.  HEENT:   No headaches,  Difficulty swallowing,  Tooth/dental problems, or  Sore throat,                No sneezing, itching, ear ache, +nasal congestion, post nasal drip,   CV:  No chest pain,  Orthopnea, PND, swelling in lower extremities, anasarca, dizziness, palpitations, syncope.   GI  No heartburn, indigestion, abdominal pain, nausea, vomiting, diarrhea, change in bowel habits, loss of appetite, bloody stools.   Resp: No shortness of breath with exertion or at rest.  No excess mucus, no productive cough,  No non-productive cough,  No coughing up of blood.  No change in color of mucus.  No wheezing.  No chest wall deformity  Skin: no rash or lesions.  GU: no dysuria, change in color of urine, no urgency or frequency.  No flank pain, no hematuria   MS:  No joint pain or swelling.  No decreased range of motion.  No back pain.    Physical Exam  BP 120/72   Pulse 99   Ht 6\' 1"  (1.854 m)   Wt 193 lb 6.4 oz (87.7 kg)   SpO2 97%   BMI 25.52 kg/m   GEN: A/Ox3; pleasant , NAD, well nourished    HEENT:  [place]/AT,  NOSE-clear,  THROAT-clear, no lesions, no postnasal drip or exudate noted.   NECK:  Supple w/ fair ROM; no JVD; normal carotid impulses w/o bruits; no thyromegaly or nodules palpated; no lymphadenopathy.    RESP  Clear  P & A; w/o, wheezes/ rales/ or rhonchi. no accessory muscle use, no dullness to percussion  CARD:  RRR, no m/r/g, no peripheral edema, pulses intact, no cyanosis or clubbing.  GI:   Soft & nt; nml bowel sounds; no organomegaly or masses detected.   Musco: Warm bil, no deformities or joint swelling noted.   Neuro: alert, no focal deficits noted.    Skin: Warm, no lesions or rashes    Lab Results:  CBC     BNP No results found for: "BNP"  ProBNP No results found for: "PROBNP"  Imaging: No results found.  Administration History     None           No data to display  No results found for: "NITRICOXIDE"      Assessment & Plan:  Assessment and Plan    Chronic cough   He experiences a chronic productive cough with intermittent exacerbations, not significantly affecting daily activities. A recent exacerbation likely resulted from a viral illness or allergies after traveling from [place]. There is no fever, chest pain, or hemoptysis. Symptoms are managed with Mucinex and Claritin. Order a chest x-ray to evaluate lung status. Advise Mucinex in liquid form or lower dose tablets for mucus management. Recommend Claritin and Flonase for allergy management. Encourage saline rinses and adequate hydration. Advise using a flutter valve to prevent mucus buildup. Plan a pulmonary function test at the next annual visit.  Bronchiectasis   He has bronchiectasis with chronic cough and mucus production, managed with Mucinex and Claritin. Last imaging in October 2023 showed clear lungs.  Previous PFTs in 2019 showed essentially normal lung function and diffusing capacity.  Monitoring with periodic imaging and pulmonary function tests is planned. Order a chest x-ray to  monitor lung condition. Plan a pulmonary function test at the next annual visit.  Allergic rhinitis   Postnasal drip is managed with Claritin and Flonase. Recent symptom increase is possibly due to pollen exposure. Continuing allergy medications is recommended. Advise Claritin and Flonase for allergy management. Recommend saline rinses for postnasal drip.  Viral illness   He reports recent fatigue and increased cough, likely due to a viral illness. There is no fever, chest pain, or other concerning symptoms. Gradual symptom improvement is reported. Advise symptomatic treatment with fluids and rest. Monitor symptoms and advise follow-up if fatigue persists.  Follow-up    A follow-up visit is planned in one year with a pulmonary function test. Schedule a follow-up visit in one year with a pulmonary function test. Advise contacting the clinic if symptoms worsen or do not improve.         Rubye Oaks, NP 10/25/2023

## 2023-10-31 DIAGNOSIS — H25811 Combined forms of age-related cataract, right eye: Secondary | ICD-10-CM | POA: Diagnosis not present

## 2023-11-09 DIAGNOSIS — H25811 Combined forms of age-related cataract, right eye: Secondary | ICD-10-CM | POA: Diagnosis not present

## 2023-11-09 DIAGNOSIS — H524 Presbyopia: Secondary | ICD-10-CM | POA: Diagnosis not present

## 2023-11-16 DIAGNOSIS — H2512 Age-related nuclear cataract, left eye: Secondary | ICD-10-CM | POA: Diagnosis not present

## 2023-11-16 DIAGNOSIS — I10 Essential (primary) hypertension: Secondary | ICD-10-CM | POA: Diagnosis not present

## 2023-11-16 DIAGNOSIS — H25812 Combined forms of age-related cataract, left eye: Secondary | ICD-10-CM | POA: Diagnosis not present

## 2024-01-03 DIAGNOSIS — Z13220 Encounter for screening for lipoid disorders: Secondary | ICD-10-CM | POA: Diagnosis not present

## 2024-01-03 DIAGNOSIS — I10 Essential (primary) hypertension: Secondary | ICD-10-CM | POA: Diagnosis not present

## 2024-01-03 DIAGNOSIS — R739 Hyperglycemia, unspecified: Secondary | ICD-10-CM | POA: Diagnosis not present

## 2024-01-03 DIAGNOSIS — Z0000 Encounter for general adult medical examination without abnormal findings: Secondary | ICD-10-CM | POA: Diagnosis not present

## 2024-01-03 DIAGNOSIS — I7121 Aneurysm of the ascending aorta, without rupture: Secondary | ICD-10-CM | POA: Diagnosis not present

## 2024-01-03 DIAGNOSIS — N529 Male erectile dysfunction, unspecified: Secondary | ICD-10-CM | POA: Diagnosis not present

## 2024-01-03 DIAGNOSIS — Z125 Encounter for screening for malignant neoplasm of prostate: Secondary | ICD-10-CM | POA: Diagnosis not present

## 2024-01-03 DIAGNOSIS — Z5181 Encounter for therapeutic drug level monitoring: Secondary | ICD-10-CM | POA: Diagnosis not present

## 2024-08-06 ENCOUNTER — Emergency Department (HOSPITAL_BASED_OUTPATIENT_CLINIC_OR_DEPARTMENT_OTHER)
Admission: EM | Admit: 2024-08-06 | Discharge: 2024-08-06 | Disposition: A | Discharge status: Home / Self Care | Attending: Emergency Medicine | Admitting: Emergency Medicine

## 2024-08-06 ENCOUNTER — Encounter (HOSPITAL_BASED_OUTPATIENT_CLINIC_OR_DEPARTMENT_OTHER): Payer: Self-pay | Admitting: *Deleted

## 2024-08-06 ENCOUNTER — Other Ambulatory Visit: Payer: Self-pay

## 2024-08-06 ENCOUNTER — Emergency Department (HOSPITAL_BASED_OUTPATIENT_CLINIC_OR_DEPARTMENT_OTHER): Admitting: Radiology

## 2024-08-06 DIAGNOSIS — R0789 Other chest pain: Secondary | ICD-10-CM | POA: Diagnosis present

## 2024-08-06 DIAGNOSIS — I10 Essential (primary) hypertension: Secondary | ICD-10-CM | POA: Diagnosis not present

## 2024-08-06 DIAGNOSIS — Z79899 Other long term (current) drug therapy: Secondary | ICD-10-CM | POA: Diagnosis not present

## 2024-08-06 DIAGNOSIS — R079 Chest pain, unspecified: Secondary | ICD-10-CM

## 2024-08-06 HISTORY — DX: Essential (primary) hypertension: I10

## 2024-08-06 LAB — CBC
HCT: 46.1 % (ref 39.0–52.0)
Hemoglobin: 16.4 g/dL (ref 13.0–17.0)
MCH: 35.9 pg — ABNORMAL HIGH (ref 26.0–34.0)
MCHC: 35.6 g/dL (ref 30.0–36.0)
MCV: 100.9 fL — ABNORMAL HIGH (ref 80.0–100.0)
Platelets: 196 K/uL (ref 150–400)
RBC: 4.57 MIL/uL (ref 4.22–5.81)
RDW: 13.2 % (ref 11.5–15.5)
WBC: 5.6 K/uL (ref 4.0–10.5)
nRBC: 0 % (ref 0.0–0.2)

## 2024-08-06 LAB — BASIC METABOLIC PANEL WITH GFR
Anion gap: 13 (ref 5–15)
BUN: 15 mg/dL (ref 8–23)
CO2: 26 mmol/L (ref 22–32)
Calcium: 10.2 mg/dL (ref 8.9–10.3)
Chloride: 104 mmol/L (ref 98–111)
Creatinine, Ser: 1.02 mg/dL (ref 0.61–1.24)
GFR, Estimated: >60 mL/min
Glucose, Bld: 87 mg/dL (ref 70–99)
Potassium: 3.7 mmol/L (ref 3.5–5.1)
Sodium: 143 mmol/L (ref 135–145)

## 2024-08-06 LAB — TROPONIN T, HIGH SENSITIVITY: Troponin T High Sensitivity: <15 ng/L (ref 0–19)

## 2024-08-06 NOTE — ED Provider Notes (Signed)
 " [place] [facility] AT [facility] Provider Note   CSN: [id number] Arrival date & time: 08/06/24  0406     Patient presents with: Chest Pain   Jerry Grant is a 69 y.o. male.   Patient is a 69 year old male with history of hypertension.  Patient presenting today with complaints of chest discomfort.  Symptoms began yesterday afternoon and have come and gone throughout the day.  He describes a pressure to the left upper chest that radiates into his left arm.  He feels slightly short of breath, but denies any nausea or diaphoresis.  He denies any recent exertional symptoms.  Patient denies any prior cardiac history.  Only risk factor is hypertension and family history.       Prior to Admission medications  Medication Sig Start Date End Date Taking? Authorizing Provider  Calcium Carb-Cholecalciferol (CALCIUM/VITAMIN D PO) Take by mouth.    [provider]  losartan  (COZAAR ) 25 MG tablet Take 1 tablet (25 mg total) by mouth daily. Patient taking differently: Take 37.5 mg by mouth daily. 1.5 tabs 02/21/21   Levander Houston, MD  Multiple Vitamin (MULTI-VITAMIN PO) Take by mouth.    [provider]    Allergies: Patient has no known allergies.    Review of Systems  All other systems reviewed and are negative.   Updated Vital Signs BP (!) 161/103   Pulse 91   Temp 98.6 F (37 C)   Resp 13   Ht 6' 1 (1.854 m)   Wt 88.5 kg   SpO2 96%   BMI 25.73 kg/m   Physical Exam Vitals and nursing note reviewed.  Constitutional:      General: He is not in acute distress.    Appearance: He is well-developed. He is not diaphoretic.  HENT:     Head: Normocephalic and atraumatic.  Cardiovascular:     Rate and Rhythm: Normal rate and regular rhythm.     Heart sounds: No murmur heard.    No friction rub.  Pulmonary:     Effort: Pulmonary effort is normal. No respiratory distress.     Breath sounds: Normal breath sounds. No wheezing or rales.   Abdominal:     General: Bowel sounds are normal. There is no distension.     Palpations: Abdomen is soft.     Tenderness: There is no abdominal tenderness.  Musculoskeletal:        General: Normal range of motion.     Cervical back: Normal range of motion and neck supple.     Right lower leg: No tenderness. No edema.     Left lower leg: No tenderness. No edema.  Skin:    General: Skin is warm and dry.  Neurological:     Mental Status: He is alert and oriented to person, place, and time.     Coordination: Coordination normal.     (all labs ordered are listed, but only abnormal results are displayed) Labs Reviewed  BASIC METABOLIC PANEL WITH GFR  CBC  TROPONIN T, HIGH SENSITIVITY    EKG: EKG Interpretation Date/Time:  Monday August 06 2024 04:14:08 EST Ventricular Rate:  89 PR Interval:  166 QRS Duration:  92 QT Interval:  365 QTC Calculation: 445 R Axis:   68  Text Interpretation: Sinus rhythm Atrial premature complex RSR' in V1 or V2, probably normal variant No significant change since 05/20/2014 Confirmed by Geroldine Berg ([id number]) on 08/06/2024 4:17:16 AM  Radiology: No results found.   Procedures  Medications Ordered in the ED - No data to display                                  Medical Decision Making Amount and/or Complexity of Data Reviewed Labs: ordered. Radiology: ordered.   Patient is a 69 year old male presenting with chest discomfort as described in the HPI.  He arrives here with stable vital signs and is afebrile.  Physical examination is unremarkable.  Laboratory studies obtained including CBC, metabolic panel, and troponin, all of which are unremarkable.  Chest x-ray showing no acute process.  Symptoms are atypical for cardiac pain and workup is unremarkable greater than 12 hours after onset.  Because of his discomfort unclear, but doubt a cardiac etiology.  I feel as though he can safely be discharged with outpatient follow-up with  cardiology as patient has never had a stress test.  He is to return in the meantime if symptoms worsen or change.     Final diagnoses:  None    ED Discharge Orders     None          Geroldine Berg, MD 08/06/24 [phone]  "

## 2024-08-06 NOTE — ED Triage Notes (Signed)
 Pt arrives ambulatory to triage with c/o chest intermittent tightness and heaviness since yesterday, with radiation to the left arm and back. SOB. Woke up this morning feeling like his blood pressure was high- c/o headache.

## 2024-08-06 NOTE — ED Notes (Signed)
 Patient transported to X-ray

## 2024-08-06 NOTE — Discharge Instructions (Signed)
 Follow-up with cardiology.  A referral has been placed and the clinic should call to make these arrangements.  Return to the ER in the meantime if your symptoms significantly worsen or change.

## 2024-08-20 ENCOUNTER — Ambulatory Visit: Admitting: Physician Assistant

## 2024-08-28 ENCOUNTER — Other Ambulatory Visit (HOSPITAL_BASED_OUTPATIENT_CLINIC_OR_DEPARTMENT_OTHER): Payer: Self-pay

## 2024-08-28 ENCOUNTER — Ambulatory Visit: Admitting: Nurse Practitioner

## 2024-08-28 VITALS — BP 130/90 | HR 90 | Ht 73.0 in | Wt 194.0 lb

## 2024-08-28 DIAGNOSIS — I10 Essential (primary) hypertension: Secondary | ICD-10-CM

## 2024-08-28 DIAGNOSIS — Z8249 Family history of ischemic heart disease and other diseases of the circulatory system: Secondary | ICD-10-CM | POA: Diagnosis not present

## 2024-08-28 DIAGNOSIS — R072 Precordial pain: Secondary | ICD-10-CM

## 2024-08-28 MED ORDER — METOPROLOL TARTRATE 100 MG PO TABS
100.0000 mg | ORAL_TABLET | Freq: Once | ORAL | 0 refills | Status: AC
  Filled 2024-08-28: qty 1, 1d supply, fill #0

## 2024-08-30 ENCOUNTER — Encounter: Payer: Self-pay | Admitting: Nurse Practitioner

## 2024-09-12 ENCOUNTER — Ambulatory Visit (HOSPITAL_COMMUNITY)

## 2024-09-28 ENCOUNTER — Other Ambulatory Visit (HOSPITAL_BASED_OUTPATIENT_CLINIC_OR_DEPARTMENT_OTHER)

## 2024-10-17 ENCOUNTER — Ambulatory Visit: Admitting: Nurse Practitioner

## 2024-10-24 ENCOUNTER — Encounter
